# Patient Record
Sex: Female | Born: 1995 | Race: Black or African American | Hispanic: No | Marital: Single | Smoking: Never smoker
Health system: Southern US, Community
[De-identification: ages and names within clinical notes are randomized; demographics above are authoritative.]

## PROBLEM LIST (undated history)

## (undated) DIAGNOSIS — L732 Hidradenitis suppurativa: Secondary | ICD-10-CM

## (undated) DIAGNOSIS — E785 Hyperlipidemia, unspecified: Secondary | ICD-10-CM

## (undated) DIAGNOSIS — J45909 Unspecified asthma, uncomplicated: Secondary | ICD-10-CM

## (undated) DIAGNOSIS — R7303 Prediabetes: Secondary | ICD-10-CM

## (undated) DIAGNOSIS — Z8709 Personal history of other diseases of the respiratory system: Secondary | ICD-10-CM

## (undated) DIAGNOSIS — F909 Attention-deficit hyperactivity disorder, unspecified type: Secondary | ICD-10-CM

## (undated) DIAGNOSIS — R351 Nocturia: Secondary | ICD-10-CM

## (undated) DIAGNOSIS — F32A Depression, unspecified: Secondary | ICD-10-CM

## (undated) DIAGNOSIS — F329 Major depressive disorder, single episode, unspecified: Secondary | ICD-10-CM

## (undated) HISTORY — PX: WISDOM TOOTH EXTRACTION: SHX21

## (undated) HISTORY — DX: Unspecified asthma, uncomplicated: J45.909

## (undated) SURGERY — Surgical Case
Anesthesia: *Unknown

---
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
## 2017-01-13 ENCOUNTER — Other Ambulatory Visit (INDEPENDENT_AMBULATORY_CARE_PROVIDER_SITE_OTHER): Payer: Self-pay | Admitting: *Deleted

## 2017-01-13 ENCOUNTER — Ambulatory Visit (INDEPENDENT_AMBULATORY_CARE_PROVIDER_SITE_OTHER): Payer: Medicaid Other | Admitting: "Endocrinology

## 2017-01-13 VITALS — BP 122/76 | HR 96 | Wt 248.6 lb

## 2017-01-13 DIAGNOSIS — I10 Essential (primary) hypertension: Secondary | ICD-10-CM

## 2017-01-13 DIAGNOSIS — L83 Acanthosis nigricans: Secondary | ICD-10-CM

## 2017-01-13 DIAGNOSIS — E063 Autoimmune thyroiditis: Secondary | ICD-10-CM

## 2017-01-13 DIAGNOSIS — E782 Mixed hyperlipidemia: Secondary | ICD-10-CM | POA: Diagnosis not present

## 2017-01-13 DIAGNOSIS — R7303 Prediabetes: Secondary | ICD-10-CM | POA: Diagnosis not present

## 2017-01-13 DIAGNOSIS — E8881 Metabolic syndrome: Secondary | ICD-10-CM | POA: Diagnosis not present

## 2017-01-13 DIAGNOSIS — F419 Anxiety disorder, unspecified: Secondary | ICD-10-CM

## 2017-01-13 DIAGNOSIS — R748 Abnormal levels of other serum enzymes: Secondary | ICD-10-CM | POA: Diagnosis not present

## 2017-01-13 DIAGNOSIS — E049 Nontoxic goiter, unspecified: Secondary | ICD-10-CM | POA: Diagnosis not present

## 2017-01-13 DIAGNOSIS — E161 Other hypoglycemia: Secondary | ICD-10-CM | POA: Diagnosis not present

## 2017-01-13 DIAGNOSIS — R1013 Epigastric pain: Secondary | ICD-10-CM | POA: Diagnosis not present

## 2017-01-13 DIAGNOSIS — F329 Major depressive disorder, single episode, unspecified: Secondary | ICD-10-CM

## 2017-01-13 LAB — POCT GLUCOSE (DEVICE FOR HOME USE): POC GLUCOSE: 128 mg/dL — AB (ref 70–99)

## 2017-01-13 LAB — POCT GLYCOSYLATED HEMOGLOBIN (HGB A1C): Hemoglobin A1C: 5.9

## 2017-01-13 MED ORDER — METFORMIN HCL 500 MG PO TABS: 500.0000 mg | ORAL_TABLET | Freq: Two times a day (BID) | ORAL | 0 refills | Status: DC

## 2017-01-13 NOTE — Patient Instructions (Addendum)
Follow up visit in 3 months with Mr. Elizabeth Howell or with me. Take ranitidine at breakfast and at dinner. Begin metformin treatment by taking one pill at dinner every day for one week, then take one pill at breakfast and one pill at dinner.

## 2017-01-13 NOTE — Progress Notes (Signed)
Subjective:  Patient Name: Elizabeth Howell Date of Birth: 1995/12/31  MRN: [id number]  Elizabeth Howell  presents to the office today for follow up evaluation and management of her obesity and related issues.    HISTORY OF PRESENT ILLNESS:   Elizabeth Howell is a 21 y.o. African-American young lady.   [name] was accompanied by her grandfather, Mr. Marchell Froman.    71. San was seen for her initial pediatric endocrine evaluation on 11/25/14:  A. Obesity:   1). She was above the 97% for weight at age 26, was down to the 96% briefly at age 26, but had been increasingly growing further above and away from the 97% since then. During the same time period her height increased to about the 85% at age 32-1/2, but had plateaued since age 65.  She developed acanthosis nigricans at least 5 years prior. She had menarche about age 44.     2). Lab tests on 06/01/14 showed a HbA1c of 6.1%.   3). Elizabeth Howell wanted to be slimmer. She was tired of being picked on for being fat. At the same time she wanted to eat what she wanted when she wanted and did not like to exercise.   B. Pertinent past medical history:   1). Medical problems: Asthma in past   2). Surgeries: None   3). Allergies: No known medication allergies; No known environmental allergies   4). GYN: Menarche at age 33-11. LMP a few days ago. Periods were regular.    5). Psych: ADHD and depression:    6). Medications: Zoloft and Vyvanse  C. Pertinent family history - Little is known about dad's FH.   1). Obesity: Mother was heavy. Mom had a pituitary tumor. She later had cardiac arrest. Her maternal great grandmother was also heavy. Daveigh looked like her mom and [name].   2). DM: None   3). Thyroid disease: None   4). ASCVD: None except below.   5). Cancers: None   6). Maternal grandmother had lupus and died of heart failure.  D. Lifestyle:   1). Diet: Lots of carbs, fast food. The maternal grandfather liked his carbs and liked to have  Elias-Fela Solis and others join him for carb treats.    2). Physical activity: None  2. Elizabeth Howell's last [facility] visit occurred on 08/27/16. In the interim she has been healthy.  A. At that visit I again prescribed ranitidine, 150 mg, twice daily. When I asked her if she was taking the ranitidine, she paused for about 20 seconds, and then said "Yes". The ranitidine helped to reduce her hunger "a little bit".    B. She resumed seeing her therapist at [facility] about once a month, but is not seeing a psychiatrist. She has not had her psych medications adjusted for quite some time.   C. She remains on sertraline and Focalin. She is also taking simvastatin, 20 mg/day..  3. Pertinent Review of Systems:  Constitutional: The patient feels "good". Her energy level is "pretty good".  Eyes: Vision is good. There are no significant eye complaints. Neck: The patient has not had any recent swelling and pain in her thyroid area. She does complain of some left posterior neck pains at times.   Heart: Heart rate increases with exercise or other physical activity. The patient has no complaints of palpitations, irregular heat beats, chest pain, or chest pressure. Gastrointestinal: She does not have as much belly hunger. She denies any upset stomach, stomach pains, diarrhea, or constipation. Arms and hands: No  cramps Legs: Muscle mass and strength seem normal. There are no complaints of numbness, tingling, burning, or pain. No edema is noted. Feet: There are no obvious foot problems. There are no complaints of numbness, tingling, burning, or pain. No edema is noted. GYN: She is having a menstrual period now. Periods are regular. Psych: She has had depression and anxiety for some time. She says, "It's a struggle.". She does not think that her medications are working.    PAST MEDICAL, FAMILY, AND SOCIAL HISTORY:  No past medical history on file.  Family History  Problem Relation Age of Onset  .  Hypertension Maternal Grandfather   . Diabetes Other      Current Outpatient Prescriptions:  .  cholecalciferol (VITAMIN D) 1000 units tablet, Take 1,000 Units by mouth daily., Disp: , Rfl:  .  dexmethylphenidate (FOCALIN XR) 20 MG 24 hr capsule, Take 25 mg by mouth daily., Disp: , Rfl:  .  sertraline (ZOLOFT) 100 MG tablet, Take 100 mg by mouth daily., Disp: , Rfl:  .  simvastatin (ZOCOR) 20 MG tablet, Take 1 tablet (20 mg total) by mouth at bedtime., Disp: 30 tablet, Rfl: 2 .  ranitidine (ZANTAC) 150 MG tablet, Take 1 tablet (150 mg total) by mouth 2 (two) times daily., Disp: 60 tablet, Rfl: 6  Allergies as of 01/13/2017  . (No Known Allergies)    1. School and Family: [name] lives with her maternal grandfather, step-grandmother, and half-brother. She is in the 11th grade. The maternal grandfather and step grandmother are the legal guardians and de facto parents for [place] and her brother.  2. Activities: She likes to read and to listen to music. She dances in her room at times, but does not like to exercise. 3. Smoking, alcohol, or drugs: None 4. Primary Care Provider: She has a new PCP, Mr. [name], PA, at the [facility].  5. [facility]: She is seeing a therapist every month.   REVIEW OF SYSTEMS: There are no other significant problems involving Elizabeth Howell's other body systems.   Objective:  Vital Signs:  BP 122/76   Pulse 96   Wt 248 lb 9.6 oz (112.8 kg)   BMI 44.74 kg/m    Ht Readings from Last 3 Encounters:  11/18/16 5' 2.5" (1.588 m) (25 %, Z= -0.69)*  06/21/15 5' 2.28" (1.582 m) (23 %, Z= -0.74)*  03/13/15 5' 2"  (1.575 m) (20 %, Z= -0.84)*   * Growth percentiles are based on CDC 2-20 Years data.   Wt Readings from Last 3 Encounters:  01/13/17 248 lb 9.6 oz (112.8 kg) (>99 %, Z= 2.46)*  11/18/16 250 lb 6.4 oz (113.6 kg) (>99 %, Z= 2.47)*  08/27/16 240 lb 6.4 oz (109 kg) (>99 %, Z= 2.38)*   * Growth  percentiles are based on CDC 2-20 Years data.   HC Readings from Last 3 Encounters:  No data found for [facility]   Body surface area is 2.23 meters squared.  No height on file for this encounter. >99 %ile (Z= 2.46) based on CDC 2-20 Years weight-for-age data using vitals from 01/13/2017.   PHYSICAL EXAM:  Constitutional: The patient appears healthy, but even more morbidly obese. She has gained another 8 pounds since her last visit, equivalent to a net gain of about 200 calories per day. She is alert, but has a very subdued affect. She does not volunteer any information. When she answers questions she gives one-word answers. I have the sense that she  tells me only what she wants me to hear.  Face: The face appears normal.  Eyes: There is no obvious arcus or proptosis. Moisture appears normal. Mouth: The oropharynx and tongue appear normal. Oral moisture is normal. There is no oral hyperpigmentation. Neck: The neck appears to be visibly enlarged. No carotid bruits are noted. The thyroid gland is still enlarged, but smaller today, at about 21-22 grams in size. The consistency of the thyroid gland is full, but softer than at her last visit. The thyroid gland is not tender to palpation. She has 3+ circumferential acanthosis nigricans.  Lungs: The lungs are clear to auscultation. Air movement is good. Heart: Heart rate and rhythm are regular. Heart sounds S1 and S2 are normal. I did not appreciate any pathologic cardiac murmurs. Abdomen: The abdomen is quite enlarged. Bowel sounds are normal. There is no obvious hepatomegaly, splenomegaly, or other mass effect.  Arms: Muscle size and bulk are normal for age. Hands: There is no obvious tremor. Phalangeal and metacarpophalangeal joints are normal. Palmar muscles are normal. Palmar skin is normal. Palmar moisture is also normal. There is no palmar hyperpigmentation Legs: Muscles appear normal for age. No edema is present. Neurologic: Strength is normal for  age in both the upper and lower extremities. Muscle tone is normal. Sensation to touch is normal in both legs.   Skin: She has multiple dark striae of her lateral sides.   LAB DATA:  No results found for this or any previous visit (from the past 504 hour(s)).   Labs 01/13/17: HbA1c 5.9%, CBG 128  Labs 11/18/16: TSH 2.85, T4 5.0; CBC normal; CMP normal, except ALP 107 (ref 43-101); cholesterol 288, triglycerides 145, HDL 39, LDL 220  Labs 08/27/16: HbA1c 6.0%  Labs 06/21/15: HbA1c 5.6%  Labs 11/25/14: HbA1c 5.5%; TSH 0.993, free T4 1.08, free T3 3.1, TPO antibody <1, anti-thyroglobulin antibody 1; C-peptide 1.81  Labs 08/16/14: CMP normal; ESR 21 (0-22); CBC normal  Labs: 06/01/14: HbA1c 6.1%, cholesterol 258, triglycerides 151, HDL 48, LDL 180; 25-OH vitamin D 39; TSH 1.743; CMP normal    Assessment and Plan:   ASSESSMENT:  1-3. Morbid obesity/insulin resistance/hyperinsulinemia:   A. Her overly fat adipose cells produce excessive amounts of harmful cytokines. Some cytokines cause hypertension. Some cytokines cause excess resistance to insulin. Her young beta cells compensate by producing excessive amounts of insulin. The hyperinsulinemia, in turn, causes acanthosis nigricans and excess gastric acid secretion, resulting in excess belly hunger, stomach upset, and epigastric pains, all grouped as "Dyspepsia".   BHerschel [place] continues to gain weight at about the rate of 2 pounds per month. Although her grandfather and I want her to exercise, Legrand Como won't do so. She is also not motivated to change her diet.   4. Hypertension: As above. Her SBP is normal today, but her DBP is mildly elevated.   5. Acanthosis: As above. Her acanthosis is worse, paralleling her gain in fat weight.  6. Dyspepsia: As above. Unfortunately, the ranitidine is not enough to offset her carb intake. I wonder how much ranitidine she actually takes. 7. Striae: The striae are essentially unchanged since her last visit.  These striae do not appear to be related to Cushing's syndrome. 8. Hyperlipidemia: The elevated lipids were due in part to high glucose levels and high free fatty acid levels, but there may also be a genetic predisposition as well.  Her cholesterol and DL were elevated in March, so Mr. Altamease Oiler appropriately started her on a statin. Simvastatin can be  a very effective drug and does not cause adverse effects in most patients, but it is also the currently available statin that is most likely to cause transaminase elevation and myalgia/myositis. I explained this fact to the family and asked them to call Mr. Altamease Oiler or me if she develops sustained muscle cramps.  9. Goiter: Her thyroid gland is slightly smaller, but still enlarged. The process of waxing and waning of thyroid gland size is c/w evolving Hashimoto's thyroiditis. She was euthyroid in March 2018, at about the lower 20% of the true normal thyroid hormone range.  10. Thyroiditis: She had had intermittent tenderness and swelling in the thyroid bed for several months prior to her initial visit and was tender bilaterally at that visit. She has also had tenderness in her thyroid bed prior to her last visit, but has not had any since then. Her Hashimoto's thyroiditis is clinically quiescent today. 11. Prediabetes:   A. Her HbA1c of 6.1% was definitely in the pre-diabetic range in September 2015. Her A1c at her last three visits were 5.5% 5.6%, and 6.0% respectively. The  HbA1c value of 6.0 2 in December 2017 was again in the prediabetes range.    B. Today, Michaela's HbA1c is still elevated at 5.9%, also c/w the diagnosis of pre-diabetes.   C. Darsi's C-peptide was mid-normal in March 2017 but was relatively low for her level of obesity. If she does not control her carb intake and does not make serious efforts to lose fat weight, her BGs will rise further into the diabetes range.   D. She needs treatment with metformin. Whether she will actually take the  medication is another question.  12. Elevated alkaline phosphatase (ALP): The most likely cause of her increased ALP would be vitamin D deficiency, low or low-normal serum calcium, and elevated or high-normal PTH. We need to check these parameters now.    PLAN:  1. Diagnostic: PTH, calcium, 25-OH vitamin D 2. Therapeutic: Eat Right Diet, exercise for an hour per day. Continue ranitidine, 150 mg, twice daily. Start metformin,  3. Patient education: We discussed all of the above at great length. Because the grandfather has not been here for prior visits, he had many questions about her obesity, prediabetes, and her medications.I reviewed all of the above issues with him. I again discussed with her and her grandfather how eating right and exercise could help her achieve her goal of weight loss. I reviewed our Eat Right Diet again with her and her grandfather. I asked her to exercise for one hour each day. I suggested that the family discuss follow up with psychiatry with her therapist.   4. Follow-up: 3 months.   Level of Service: This visit lasted in excess of 55 minutes. More than 50% of the visit was devoted to counseling.  Tillman Sers, MD, CDE Adult and Pediatric Endocrinology

## 2017-01-14 ENCOUNTER — Encounter (INDEPENDENT_AMBULATORY_CARE_PROVIDER_SITE_OTHER): Payer: Self-pay | Admitting: "Endocrinology

## 2017-01-14 DIAGNOSIS — F32A Depression, unspecified: Secondary | ICD-10-CM | POA: Insufficient documentation

## 2017-01-14 DIAGNOSIS — F329 Major depressive disorder, single episode, unspecified: Secondary | ICD-10-CM | POA: Insufficient documentation

## 2017-01-14 DIAGNOSIS — F419 Anxiety disorder, unspecified: Secondary | ICD-10-CM

## 2017-01-14 LAB — PTH, INTACT AND CALCIUM
Calcium: 9.4 mg/dL (ref 8.9–10.4)
PTH: 31 pg/mL (ref 14–64)

## 2017-01-14 LAB — VITAMIN D 25 HYDROXY (VIT D DEFICIENCY, FRACTURES): Vit D, 25-Hydroxy: 31 ng/mL (ref 30–100)

## 2017-01-17 ENCOUNTER — Other Ambulatory Visit: Payer: Self-pay | Admitting: "Endocrinology

## 2017-01-17 DIAGNOSIS — R1013 Epigastric pain: Secondary | ICD-10-CM

## 2017-01-28 ENCOUNTER — Telehealth (INDEPENDENT_AMBULATORY_CARE_PROVIDER_SITE_OTHER): Payer: Self-pay | Admitting: "Endocrinology

## 2017-01-28 NOTE — Telephone Encounter (Signed)
°  Who's calling (name and relationship to patient) : Legrand Como (granddad)  Best contact number: [phone]  Provider they see: Tobe Sos  Reason for call: Grandmother stated that the medication is making patient vomit.  She start taking it 2 times a day.  Please call so we can know whether to stop the medication.  Please call back today if all possible.     PRESCRIPTION REFILL ONLY  Name of prescription:  Pharmacy:

## 2017-01-28 NOTE — Telephone Encounter (Signed)
Routed to provider

## 2017-01-29 ENCOUNTER — Telehealth (INDEPENDENT_AMBULATORY_CARE_PROVIDER_SITE_OTHER): Payer: Self-pay | Admitting: "Endocrinology

## 2017-01-29 NOTE — Telephone Encounter (Signed)
1. Grandmother called yesterday, but I did not see the call until midnight. 2. I tried to call her at home, but there was not any answer or opportunity to leave a voicemail message. I also tried to call her on her cell phone, but she was not available and her voicemail had not been activated. I also tried the other cell phone number listed on her EPIC record, but again there was no answer or ability to leave a voicemail message.  Tillman Sers, MD, CDE

## 2017-01-30 ENCOUNTER — Telehealth (INDEPENDENT_AMBULATORY_CARE_PROVIDER_SITE_OTHER): Payer: Self-pay | Admitting: "Endocrinology

## 2017-01-30 NOTE — Telephone Encounter (Signed)
1. I called the grandfather and was able to talk with him. 2. Subjective: Elizabeth Howell tolerated the one 500 mg metformin pill in the evening for the first week quite well. Unfortunately, when she added the second pill at breakfast she developed nausea, vomiting, and diarrhea. 3. Assessment: Elizabeth Howell is having trouble tolerating the 500 mg of metformin twice daily. 4. Plan:  A. Until school is over, give her just the one 500 mg metformin pill at dinner each evening.   B. After school ends for the year, change her to 1/2 of a 500 mg metformin pill, twice daily.Call if having problems.  Tillman Sers, MD, CDE

## 2017-02-08 ENCOUNTER — Ambulatory Visit (HOSPITAL_COMMUNITY)
Admission: EM | Admit: 2017-02-08 | Discharge: 2017-02-08 | Disposition: A | Discharge status: Home / Self Care | Payer: Medicaid Other | Attending: Family Medicine | Admitting: Family Medicine

## 2017-02-08 ENCOUNTER — Encounter (HOSPITAL_COMMUNITY): Payer: Self-pay | Admitting: Family Medicine

## 2017-02-08 DIAGNOSIS — L03818 Cellulitis of other sites: Secondary | ICD-10-CM | POA: Diagnosis not present

## 2017-02-08 DIAGNOSIS — W540XXA Bitten by dog, initial encounter: Secondary | ICD-10-CM | POA: Diagnosis not present

## 2017-02-08 DIAGNOSIS — S61252A Open bite of right middle finger without damage to nail, initial encounter: Secondary | ICD-10-CM

## 2017-02-08 MED ORDER — DOXYCYCLINE HYCLATE 100 MG PO TABS: 100.0000 mg | ORAL_TABLET | Freq: Two times a day (BID) | ORAL | 0 refills | Status: DC

## 2017-02-08 NOTE — ED Triage Notes (Signed)
Pt reports her dog Advertising account executive?) bit her on right middle finger 4 days ago  Sts dog is up to date w/vaccination  A&O x4... NAD... Ambulatory

## 2017-02-08 NOTE — ED Provider Notes (Addendum)
[place]    CSN: [id number] Arrival date & time: 02/08/17  1415     History   Chief Complaint Chief Complaint  Patient presents with  . Animal Bite    HPI Will Schier is a 21 y.o. female.   Is an 21 year old woman who presents with recent dog bite to her right hand. The middle finger was lacerated superficially and over the last day or so she's had difficulty bending the finger without pain.  She had a tetanus shot when she was in middle school, and she is now about to graduate from high school 4 years later. Patient goes to [employer].      History reviewed. No pertinent past medical history.  Patient Active Problem List   Diagnosis Date Noted  . Anxiety and depression 01/14/2017  . Dyslipidemia 11/19/2016  . Morbid obesity ([place]) 11/25/2014  . Insulin resistance 11/25/2014  . Hyperinsulinemia 11/25/2014  . Essential hypertension, benign 11/25/2014  . Acanthosis nigricans, acquired 11/25/2014  . Dyspepsia 11/25/2014  . Goiter 11/25/2014  . Thyroiditis, autoimmune 11/25/2014  . 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 Skin striae 11/25/2014  . Prediabetes 11/25/2014    Past Surgical History:  Procedure Laterality Date  . WISDOM TOOTH EXTRACTION      OB History    No data available       Home Medications    Prior to Admission medications   Medication Sig Start Date End Date Taking? Authorizing Provider  cholecalciferol (VITAMIN D) 1000 units tablet Take 1,000 Units by mouth daily.    [provider]  dexmethylphenidate (FOCALIN XR) 20 MG 24 hr capsule Take 25 mg by mouth daily.    [provider]  doxycycline (VIBRA-TABS) 100 MG tablet Take 1 tablet (100 mg total) by mouth 2 (two) times daily. 02/08/17   Robyn Haber, MD  metFORMIN (GLUCOPHAGE) 500 MG tablet Take 1 tablet (500 mg total) by mouth 2 (two) times daily with a meal. 01/13/17   Sherrlyn Hock, MD  ranitidine (ZANTAC) 150 MG tablet TAKE 1 TABLET  BY MOUTH TWICE A DAY 01/20/17   Sherrlyn Hock, MD  sertraline (ZOLOFT) 100 MG tablet Take 100 mg by mouth daily.    [provider]  simvastatin (ZOCOR) 20 MG tablet Take 1 tablet (20 mg total) by mouth at bedtime. 11/19/16   Clent Demark, PA-C    Family History Family History  Problem Relation Age of Onset  . Hypertension Maternal Grandfather   . Diabetes Other     Social History Social History  Substance Use Topics  . Smoking status: Never Smoker  . Smokeless tobacco: Never Used  . Alcohol use Not on file     Allergies   Patient has no known allergies.   Review of Systems Review of Systems  Skin: Positive for wound.  All other systems reviewed and are negative.    Physical Exam Triage Vital Signs ED Triage Vitals  Enc Vitals Group     BP      Pulse      Resp      Temp      Temp src      SpO2      Weight      Height      Head Circumference      Peak Flow      Pain Score      Pain Loc      Pain Edu?      Excl. in [place]?  No data found.   Updated Vital Signs BP 118/78 (BP Location: Left Arm)   Pulse (!) 113   Temp 99.5 F (37.5 C) (Oral)   Resp 20   SpO2 99%    Physical Exam  Constitutional: She is oriented to person, place, and time. She appears well-developed and well-nourished.  HENT:  Right Ear: External ear normal.  Left Ear: External ear normal.  Eyes: Conjunctivae are normal. Pupils are equal, round, and reactive to light.  Neck: Normal range of motion. Neck supple.  Pulmonary/Chest: Effort normal.  Musculoskeletal: Normal range of motion.  Neurological: She is alert and oriented to person, place, and time.  Skin: Skin is warm and dry.  Puncture wound just proximal to the nail on the middle right finger. There are superficial streaks of laceration on the distal phalanx of the ipsilateral finger and mild swelling and tenderness in the distal phalanx.  Nursing note and vitals reviewed.    UC Treatments / Results   Labs (all labs ordered are listed, but only abnormal results are displayed) Labs Reviewed - No data to display  EKG  EKG Interpretation None       Radiology No results found.  Procedures Procedures (including critical care time)  Medications Ordered in UC Medications - No data to display   Initial Impression / Assessment and Plan / UC Course  I have reviewed the triage vital signs and the nursing notes.  Pertinent labs & imaging results that were available during my care of the patient were reviewed by me and considered in my medical decision making (see chart for details).     Final Clinical Impressions(s) / UC Diagnoses   Final diagnoses:  Dog bite, initial encounter  Cellulitis of other specified site    New Prescriptions New Prescriptions   DOXYCYCLINE (VIBRA-TABS) 100 MG TABLET    Take 1 tablet (100 mg total) by mouth 2 (two) times daily.     Robyn Haber, MD 02/08/17 1428    Robyn Haber, MD 02/08/17 1429

## 2017-02-17 ENCOUNTER — Encounter (INDEPENDENT_AMBULATORY_CARE_PROVIDER_SITE_OTHER): Payer: Self-pay | Admitting: Physician Assistant

## 2017-02-17 ENCOUNTER — Ambulatory Visit (INDEPENDENT_AMBULATORY_CARE_PROVIDER_SITE_OTHER): Payer: Medicaid Other | Admitting: Physician Assistant

## 2017-02-17 VITALS — BP 105/71 | HR 123 | Temp 98.4°F | Wt 252.8 lb

## 2017-02-17 DIAGNOSIS — F329 Major depressive disorder, single episode, unspecified: Secondary | ICD-10-CM | POA: Diagnosis not present

## 2017-02-17 DIAGNOSIS — F988 Other specified behavioral and emotional disorders with onset usually occurring in childhood and adolescence: Secondary | ICD-10-CM | POA: Diagnosis not present

## 2017-02-17 DIAGNOSIS — F32A Depression, unspecified: Secondary | ICD-10-CM

## 2017-02-17 DIAGNOSIS — F419 Anxiety disorder, unspecified: Secondary | ICD-10-CM | POA: Diagnosis not present

## 2017-02-17 NOTE — Patient Instructions (Signed)

## 2017-02-17 NOTE — Progress Notes (Signed)
Subjective:  Patient ID: Elizabeth Howell, female    DOB: 1996/07/29  Age: 21 y.o. MRN: [id number]  CC: depression, anxiety, and ADD  HPI Deondria Puryear is a 21 y.o. female with a PMH of Depression, anxiety, and ADD presents on follow up for these conditions. Goes to [place] once a month now. Has been taking Sertraline 100mg  continuously for approximately 1.5 years with little noticeable effect. Says she feels well in regards to mood, however. Uses Focalin XR to little effect. Mother states pt has tried Adderrall, Vyvanse, and Concerta with little effect. Patient is getting ready to graduate and has been working on improving her grades. Teachers have applauded her efforts and pt's mother states that she has been receiving the top grades in her classroom. Patient no longer feels bullied. Main focus now is on graduating, does not have a set plan or goal for after graduation. Patient does not endorse any symptoms or other complaints.     Outpatient Medications Prior to Visit  Medication Sig Dispense Refill  . cholecalciferol (VITAMIN D) 1000 units tablet Take 1,000 Units by mouth daily.    Marland Kitchen dexmethylphenidate (FOCALIN XR) 20 MG 24 hr capsule Take 25 mg by mouth daily.    Marland Kitchen doxycycline (VIBRA-TABS) 100 MG tablet Take 1 tablet (100 mg total) by mouth 2 (two) times daily. 20 tablet 0  . metFORMIN (GLUCOPHAGE) 500 MG tablet Take 1 tablet (500 mg total) by mouth 2 (two) times daily with a meal. 60 tablet 0  . ranitidine (ZANTAC) 150 MG tablet TAKE 1 TABLET BY MOUTH TWICE A DAY 60 tablet 6  . sertraline (ZOLOFT) 100 MG tablet Take 100 mg by mouth daily.    . simvastatin (ZOCOR) 20 MG tablet Take 1 tablet (20 mg total) by mouth at bedtime. 30 tablet 2   No facility-administered medications prior to visit.      ROS Review of Systems  Constitutional: Negative for chills, fever and malaise/fatigue.  Eyes: Negative for blurred vision.  Respiratory: Negative for  shortness of breath.   Cardiovascular: Negative for chest pain and palpitations.  Gastrointestinal: Negative for abdominal pain and nausea.  Genitourinary: Negative for dysuria and hematuria.  Musculoskeletal: Negative for joint pain and myalgias.  Skin: Negative for rash.  Neurological: Negative for tingling and headaches.  Psychiatric/Behavioral: Negative for depression. The patient is not nervous/anxious.     Objective:  BP 105/71 (BP Location: Right Arm, Patient Position: Sitting, Cuff Size: Large)   Pulse (!) 123   Temp 98.4 F (36.9 C) (Oral)   Wt 252 lb 12.8 oz (114.7 kg)   LMP 02/16/2017 (Exact Date)   SpO2 96%   BMI 45.50 kg/m   BP/Weight 02/17/2017 02/08/2017 7/82/9562  Systolic BP 130 865 784  Diastolic BP 71 78 76  Wt. (Lbs) 252.8 - 248.6  BMI 45.5 - 44.74      Physical Exam  Constitutional: She is oriented to person, place, and time.  Well developed, obese, NAD, polite  HENT:  Head: Normocephalic and atraumatic.  Eyes: No scleral icterus.  Neck: Normal range of motion. Neck supple. No thyromegaly present.  Cardiovascular: Normal rate, regular rhythm and normal heart sounds.   Pulmonary/Chest: Effort normal and breath sounds normal.  Abdominal: There is no tenderness.  Neurological: She is alert and oriented to person, place, and time.  Skin: Skin is warm and dry. No rash noted. No erythema. No pallor.  Psychiatric: She has a normal mood and affect. Her behavior is normal.  Thought content normal.  Vitals reviewed.    Assessment & Plan:   1. Anxiety and depression - Continue on sertraline 100mg  - Continue therapy with [facility] - 15 minutes of counseling patient with focus on setting goals and looking forward to her future.  2. Attention deficit disorder, unspecified hyperactivity presence - Continue on Focalin XR - Address issue of ADD with current psychiatrist. Seems that patient is able to obtain high grades if she  applies herself or is properly motivated.    No orders of the defined types were placed in this encounter.   Follow-up: Return in about 3 months (around 05/20/2017) for physical exam.   Clent Demark PA

## 2017-02-23 ENCOUNTER — Other Ambulatory Visit (INDEPENDENT_AMBULATORY_CARE_PROVIDER_SITE_OTHER): Payer: Self-pay | Admitting: "Endocrinology

## 2017-02-23 DIAGNOSIS — R7303 Prediabetes: Secondary | ICD-10-CM

## 2017-03-14 ENCOUNTER — Other Ambulatory Visit (INDEPENDENT_AMBULATORY_CARE_PROVIDER_SITE_OTHER): Payer: Self-pay | Admitting: Physician Assistant

## 2017-03-14 ENCOUNTER — Telehealth (INDEPENDENT_AMBULATORY_CARE_PROVIDER_SITE_OTHER): Payer: Self-pay | Admitting: Physician Assistant

## 2017-03-14 DIAGNOSIS — E785 Hyperlipidemia, unspecified: Secondary | ICD-10-CM

## 2017-03-14 MED ORDER — SIMVASTATIN 20 MG PO TABS: 20.0000 mg | ORAL_TABLET | Freq: Every day | ORAL | 2 refills | Status: DC

## 2017-03-14 NOTE — Telephone Encounter (Signed)
FWD to PCP. Elizabeth Howell Elizabeth Howell, CMA  

## 2017-03-14 NOTE — Telephone Encounter (Signed)
Patient notified. Nat Christen, CMA

## 2017-03-14 NOTE — Telephone Encounter (Signed)
Simvastatin has been sent. You may also call pt to let her know. Thanks

## 2017-03-14 NOTE — Telephone Encounter (Signed)
Shirlean Mylar patient's grandmother called stated [facility] informed her they have faxed refill requests for patient RX:  simvastatin (ZOCOR) 20 MG tablet [[id number]]   Please send Rx to : Pharmacy:  [id number] - [place], [place]. DEA #:  [id number]   Please let patient know when rx sent.  Patient is out of medication.

## 2017-04-05 ENCOUNTER — Other Ambulatory Visit (INDEPENDENT_AMBULATORY_CARE_PROVIDER_SITE_OTHER): Payer: Self-pay | Admitting: "Endocrinology

## 2017-04-05 DIAGNOSIS — R7303 Prediabetes: Secondary | ICD-10-CM

## 2017-04-14 ENCOUNTER — Ambulatory Visit (INDEPENDENT_AMBULATORY_CARE_PROVIDER_SITE_OTHER): Payer: Medicaid Other | Admitting: "Endocrinology

## 2017-04-16 ENCOUNTER — Telehealth (INDEPENDENT_AMBULATORY_CARE_PROVIDER_SITE_OTHER): Payer: Self-pay | Admitting: Physician Assistant

## 2017-04-16 NOTE — Telephone Encounter (Signed)
Go to Urgent Care, that is what they are there for.

## 2017-04-16 NOTE — Telephone Encounter (Signed)
Patient grandmother called stated patient has painful cyst under her arm, looks red, no fever but last time patient had cyst, drainage was required.   Gave appt for 04/25/2017,  Per grandmother would like for patient to be seen sooner or if [name] PA can recommend something for relief until comes to appt.  Please follow up with patient

## 2017-04-16 NOTE — Telephone Encounter (Signed)
FWD to PCP. Tempestt S Roberts, CMA  

## 2017-04-17 NOTE — Telephone Encounter (Signed)
Called patient and grandmother, no answer and no voicemail to leave a message. Nat Christen, CMA

## 2017-04-19 ENCOUNTER — Encounter (HOSPITAL_COMMUNITY): Payer: Self-pay | Admitting: Emergency Medicine

## 2017-04-19 ENCOUNTER — Ambulatory Visit (HOSPITAL_COMMUNITY)
Admission: EM | Admit: 2017-04-19 | Discharge: 2017-04-19 | Disposition: A | Discharge status: Home / Self Care | Payer: Medicaid Other | Attending: Internal Medicine | Admitting: Internal Medicine

## 2017-04-19 DIAGNOSIS — L02411 Cutaneous abscess of right axilla: Secondary | ICD-10-CM | POA: Diagnosis not present

## 2017-04-19 MED ORDER — DOXYCYCLINE HYCLATE 100 MG PO CAPS: 100.0000 mg | ORAL_CAPSULE | Freq: Two times a day (BID) | ORAL | 0 refills | Status: AC

## 2017-04-19 NOTE — ED Triage Notes (Signed)
The patient presented to the [facility] with a complaint of abscesses under both arms x 1 month.

## 2017-04-19 NOTE — Discharge Instructions (Signed)
Take antibiotics as directed. Keep incision site clean and dry. Take tylenol/motrin for pain. Monitor for worsening of symptoms, fever, pain, surrounding erythema/warmth despite antibiotic use, follow up for reevaluation. You can try chlorhexidine cleaning of your skin 1-2 times a week, especially after shaving to help prevent recurrence. Follow up with PCP for further evaluation needed.

## 2017-04-19 NOTE — ED Provider Notes (Signed)
CSN: [id number]     Arrival date & time 04/19/17  1214 History   None    Chief Complaint  Patient presents with  . Abscess   (Consider location/radiation/quality/duration/timing/severity/associated sxs/prior Treatment) 21 year old female comes in for bilateral axilla abscess. She states she has recurrent abscess of the left axilla, but has now developed an abscess in her right axilla as well. Lesion on her left axilla has been present for one month, slight tenderness on palpation, otherwise denies waxing and waning of size, surrounding erythema, increased warmth. Right axilla abscess developed 2 days ago, which is very tender to palpation, also denies waxing and waning of size, surrounding erythema, increased warmth. Denies fever, chills, night sweats. Has not taken anything for pain. She does shave her underarms. No changes in skin products.      History reviewed. No pertinent past medical history. Past Surgical History:  Procedure Laterality Date  . WISDOM TOOTH EXTRACTION     Family History  Problem Relation Age of Onset  . Hypertension Maternal Grandfather   . Diabetes Other    Social History  Substance Use Topics  . Smoking status: Never Smoker  . Smokeless tobacco: Never Used  . Alcohol use Not on file   OB History    No data available     Review of Systems  Reason unable to perform ROS: See HPI as above.    Allergies  Patient has no known allergies.  Home Medications   Prior to Admission medications   Medication Sig Start Date End Date Taking? Authorizing Provider  dexmethylphenidate (FOCALIN XR) 20 MG 24 hr capsule Take 25 mg by mouth daily.   Yes [provider]  ranitidine (ZANTAC) 150 MG tablet TAKE 1 TABLET BY MOUTH TWICE A DAY 01/20/17  Yes Sherrlyn Hock, MD  simvastatin (ZOCOR) 20 MG tablet Take 1 tablet (20 mg total) by mouth at bedtime. 03/14/17  Yes Clent Demark, PA-C  doxycycline (VIBRAMYCIN) 100 MG capsule Take 1 capsule (100 mg  total) by mouth 2 (two) times daily. 04/19/17 04/26/17  Ok Edwards, PA-C   Meds Ordered and Administered this Visit  Medications - No data to display  BP 111/69 (BP Location: Right Arm)   Pulse 94   Temp 98.3 F (36.8 C) (Oral)   Resp 20   SpO2 100%  No data found.   Physical Exam  Constitutional: She is oriented to person, place, and time. She appears well-developed and well-nourished. No distress.  HENT:  Head: Normocephalic and atraumatic.  Neurological: She is alert and oriented to person, place, and time.  Skin:  Left axilla with scar tissue consistent with prior I&D's. 2 cm x 1 cm cyst palpated. No induration felt, no surrounding erythema, increased warmth.  Right axilla abscess palpated, 1 cm x 1 cm size. Small drainage seen. No surrounding erythema, increased warmth.  Psychiatric: She has a normal mood and affect. Her behavior is normal. Judgment normal.    Urgent Care Course     .Marland KitchenIncision and Drainage Date/Time: 04/19/2017 1:54 PM Performed by: Cathlean Sauer V Authorized by: Sherlene Shams   Consent:    Consent obtained:  Verbal   Consent given by:  Patient   Risks discussed:  Bleeding, incomplete drainage, infection and pain   Alternatives discussed:  Alternative treatment Location:    Type:  Abscess   Size:  1cm x 1 cm   Location:  Upper extremity   Upper extremity location:  Arm   Arm location:  R upper arm Pre-procedure details:    Skin preparation:  Betadine Anesthesia (see MAR for exact dosages):    Anesthesia method:  Local infiltration   Local anesthetic:  Lidocaine 2% WITH epi Procedure type:    Complexity:  Simple Procedure details:    Needle aspiration: no     Incision types:  Stab incision   Scalpel blade:  11   Wound management:  Debrided   Drainage:  Purulent and bloody   Drainage amount:  Scant   Wound treatment:  Wound left open   Packing materials:  None Post-procedure details:    Patient tolerance of procedure:  Tolerated well, no  immediate complications    (including critical care time)  Labs Review Labs Reviewed - No data to display  Imaging Review No results found.     MDM   1. Abscess of axilla, right    Discussed with patient I am suspicions that left axilla cyst is an abscess. Given prior I&D, discussed possibility of scar tissue versus abscess. Discussed with patient , right axilla abscess treatment with I&D versus antibiotic treatment. Patient elected to proceed with I&D for the right abscess, and antibiotic treatment to cover for possible abscess of the left axilla. Patient tolerated procedure well. Discussed post I&D care. Start Doxycylcine 100mg  BID x 7 days. Side effects discussed with patient. Monitor for worsening of symptoms, fever, increased surrounding erythema/increased warmth despite antibiotic treatment, follow-up for reevaluation.   Ok Edwards, PA-C 04/19/17 [phone]

## 2017-04-25 ENCOUNTER — Ambulatory Visit (INDEPENDENT_AMBULATORY_CARE_PROVIDER_SITE_OTHER): Payer: Medicaid Other | Admitting: Physician Assistant

## 2017-05-03 ENCOUNTER — Other Ambulatory Visit (INDEPENDENT_AMBULATORY_CARE_PROVIDER_SITE_OTHER): Payer: Self-pay | Admitting: "Endocrinology

## 2017-05-03 DIAGNOSIS — R7303 Prediabetes: Secondary | ICD-10-CM

## 2017-05-31 ENCOUNTER — Other Ambulatory Visit (INDEPENDENT_AMBULATORY_CARE_PROVIDER_SITE_OTHER): Payer: Self-pay | Admitting: "Endocrinology

## 2017-05-31 DIAGNOSIS — R7303 Prediabetes: Secondary | ICD-10-CM

## 2017-07-02 ENCOUNTER — Encounter (INDEPENDENT_AMBULATORY_CARE_PROVIDER_SITE_OTHER): Payer: Self-pay | Admitting: Physician Assistant

## 2017-07-02 ENCOUNTER — Ambulatory Visit (INDEPENDENT_AMBULATORY_CARE_PROVIDER_SITE_OTHER): Payer: Medicaid Other | Admitting: Physician Assistant

## 2017-07-02 VITALS — BP 107/73 | HR 109 | Temp 97.5°F | Wt 258.4 lb

## 2017-07-02 DIAGNOSIS — L732 Hidradenitis suppurativa: Secondary | ICD-10-CM

## 2017-07-02 DIAGNOSIS — R7303 Prediabetes: Secondary | ICD-10-CM | POA: Diagnosis not present

## 2017-07-02 DIAGNOSIS — Z23 Encounter for immunization: Secondary | ICD-10-CM

## 2017-07-02 LAB — POCT GLYCOSYLATED HEMOGLOBIN (HGB A1C): Hemoglobin A1C: 6

## 2017-07-02 MED ORDER — METFORMIN HCL 500 MG PO TABS: 500.0000 mg | ORAL_TABLET | Freq: Two times a day (BID) | ORAL | 5 refills | Status: DC

## 2017-07-02 MED ORDER — DOXYCYCLINE HYCLATE 100 MG PO TABS: 100.0000 mg | ORAL_TABLET | Freq: Two times a day (BID) | ORAL | 0 refills | Status: DC

## 2017-07-02 MED ORDER — CLINDAMYCIN PHOSPHATE 1 % EX GEL: Freq: Two times a day (BID) | CUTANEOUS | 0 refills | Status: DC

## 2017-07-02 NOTE — Progress Notes (Signed)
Subjective:  Patient ID: Elizabeth Howell, female    DOB: 10/31/1995  Age: 21 y.o. MRN: [id number]  CC:  Skin infection  HPI  Elizabeth Howell is a 21 y.o. female with a PMH of Depression, anxiety, and ADD presents with complaint of boil in the left axilla. This is the fifth occurrence in the same location. Has seen hardening of the skin with reinfection and drainage. The lesion has partially drained today. Current pain is moderate. Has not taken anything for relief. Does not endorse any other symptoms.    Outpatient Medications Prior to Visit  Medication Sig Dispense Refill  . dexmethylphenidate (FOCALIN XR) 20 MG 24 hr capsule Take 25 mg by mouth daily.    . metFORMIN (GLUCOPHAGE) 500 MG tablet TAKE 1 TABLET (500 MG TOTAL) BY MOUTH 2 (TWO) TIMES DAILY WITH A MEAL. 60 tablet 1  . ranitidine (ZANTAC) 150 MG tablet TAKE 1 TABLET BY MOUTH TWICE A DAY 60 tablet 6  . simvastatin (ZOCOR) 20 MG tablet Take 1 tablet (20 mg total) by mouth at bedtime. 30 tablet 2   No facility-administered medications prior to visit.      ROS Review of Systems  Constitutional: Negative for chills, fever and malaise/fatigue.  Eyes: Negative for blurred vision.  Respiratory: Negative for shortness of breath.   Cardiovascular: Negative for chest pain and palpitations.  Gastrointestinal: Negative for abdominal pain and nausea.  Genitourinary: Negative for dysuria and hematuria.  Musculoskeletal: Negative for joint pain and myalgias.  Skin: Negative for rash.  Neurological: Negative for tingling and headaches.  Psychiatric/Behavioral: Negative for depression. The patient is not nervous/anxious.     Objective:  BP 107/73 (BP Location: Right Arm, Patient Position: Sitting, Cuff Size: Large)   Pulse (!) 109   Temp (!) 97.5 F (36.4 C) (Oral)   Wt 258 lb 6.4 oz (117.2 kg)   LMP 06/18/2017 (Approximate)   SpO2 94%   BMI 46.51 kg/m   BP/Weight 07/02/2017 11/22/7562 11/16/2949  Systolic BP 884 166 063   Diastolic BP 73 69 71  Wt. (Lbs) 258.4 - 252.8  BMI 46.51 - 45.5      Physical Exam  Constitutional: She is oriented to person, place, and time.  Well developed, obese, NAD, polite  HENT:  Head: Normocephalic and atraumatic.  Eyes: No scleral icterus.  Neck: Normal range of motion. Neck supple. No thyromegaly present.  Cardiovascular: Normal rate, regular rhythm and normal heart sounds.   Pulmonary/Chest: Effort normal and breath sounds normal.  Musculoskeletal: She exhibits no edema.  Neurological: She is alert and oriented to person, place, and time. No cranial nerve deficit. Coordination normal.  Skin: Skin is warm and dry. No rash noted. No erythema. No pallor.  Small papular lesion of approximately one centimeter in diameter. No bleeding, suppuration, induration, erythema, streaking, or stranding.  Psychiatric: She has a normal mood and affect. Her behavior is normal. Thought content normal.  Vitals reviewed.    Assessment & Plan:   1. Hidradenitis suppurativa of left axilla - Ambulatory referral to General Surgery - Begin doxycycline (VIBRA-TABS) 100 MG tablet; Take 1 tablet (100 mg total) by mouth 2 (two) times daily.  Dispense: 20 tablet; Refill: 0 - Begin clindamycin (CLINDAGEL) 1 % gel; Apply topically 2 (two) times daily.  Dispense: 30 g; Refill: 0  2. Prediabetes - HgB A1c 6.0% in clinic today. Last A1c 5.9% in April 2018 - CMP - Refill Metformin 500 mg BID.    Meds ordered this encounter  Medications  .  doxycycline (VIBRA-TABS) 100 MG tablet    Sig: Take 1 tablet (100 mg total) by mouth 2 (two) times daily.    Dispense:  20 tablet    Refill:  0    Order Specific Question:   Supervising Provider    Answer:   Tresa Garter [id number]  . clindamycin (CLINDAGEL) 1 % gel    Sig: Apply topically 2 (two) times daily.    Dispense:  30 g    Refill:  0    Order Specific Question:   Supervising Provider    Answer:   Tresa Garter [[id number]]     Follow-up: Return in about 6 months (around 12/31/2017) for Prediabetes.   Clent Demark PA

## 2017-07-02 NOTE — Patient Instructions (Signed)
Hidradenitis Suppurativa Hidradenitis suppurativa is a long-term (chronic) skin disease that starts with blocked sweat glands or hair follicles. Bacteria may grow in these blocked openings of your skin. Hidradenitis suppurativa is like a severe form of acne that develops in areas of your body where acne would be unusual. It is most likely to affect the areas of your body where skin rubs against skin and becomes moist. This includes your:  Underarms.  Groin.  Genital areas.  Buttocks.  Upper thighs.  Breasts.  Hidradenitis suppurativa may start out with small pimples. The pimples can develop into deep sores that break open (rupture) and drain pus. Over time your skin may thicken and become scarred. Hidradenitis suppurativa cannot be passed from person to person. What are the causes? The exact cause of hidradenitis suppurativa is not known. This condition may be due to:  Female and female hormones. The condition is rare before and after puberty.  An overactive body defense system (immune system). Your immune system may overreact to the blocked hair follicles or sweat glands and cause swelling and pus-filled sores.  What increases the risk? You may have a higher risk of hidradenitis suppurativa if you:  Are a woman.  Are between ages 11 and 55.  Have a family history of hidradenitis suppurativa.  Have a personal history of acne.  Are overweight.  Smoke.  Take the drug lithium.  What are the signs or symptoms? The first signs of an outbreak are usually painful skin bumps that look like pimples. As the condition progresses:  Skin bumps may get bigger and grow deeper into the skin.  Bumps under the skin may rupture and drain smelly pus.  Skin may become itchy and infected.  Skin may thicken and scar.  Drainage may continue through tunnels under the skin (fistulas).  Walking and moving your arms can become painful.  How is this diagnosed? Your health care provider may  diagnose hidradenitis suppurativa based on your medical history and your signs and symptoms. A physical exam will also be done. You may need to see a health care provider who specializes in skin diseases (dermatologist). You may also have tests done to confirm the diagnosis. These can include:  Swabbing a sample of pus or drainage from your skin so it can be sent to the lab and tested for infection.  Blood tests to check for infection.  How is this treated? The same treatment will not work for everybody with hidradenitis suppurativa. Your treatment will depend on how severe your symptoms are. You may need to try several treatments to find what works best for you. Part of your treatment may include cleaning and bandaging (dressing) your wounds. You may also have to take medicines, such as the following:  Antibiotics.  Acne medicines.  Medicines to block or suppress the immune system.  A diabetes medicine (metformin) is sometimes used to treat this condition.  For women, birth control pills can sometimes help relieve symptoms.  You may need surgery if you have a severe case of hidradenitis suppurativa that does not respond to medicine. Surgery may involve:  Using a laser to clear the skin and remove hair follicles.  Opening and draining deep sores.  Removing the areas of skin that are diseased and scarred.  Follow these instructions at home:  Learn as much as you can about your disease, and work closely with your health care providers.  Take medicines only as directed by your health care provider.  If you were prescribed   an antibiotic medicine, finish it all even if you start to feel better.  If you are overweight, losing weight may be very helpful. Try to reach and maintain a healthy weight.  Do not use any tobacco products, including cigarettes, chewing tobacco, or electronic cigarettes. If you need help quitting, ask your health care provider.  Do not shave the areas where you  get hidradenitis suppurativa.  Do not wear deodorant.  Wear loose-fitting clothes.  Try not to overheat and get sweaty.  Take a daily bleach bath as directed by your health care provider. ? Fill your bathtub halfway with water. ? Pour in  cup of unscented household bleach. ? Soak for 5-10 minutes.  Cover sore areas with a warm, clean washcloth (compress) for 5-10 minutes. Contact a health care provider if:  You have a flare-up of hidradenitis suppurativa.  You have chills or a fever.  You are having trouble controlling your symptoms at home. This information is not intended to replace advice given to you by your health care provider. Make sure you discuss any questions you have with your health care provider. Document Released: 04/16/2004 Document Revised: 02/08/2016 Document Reviewed: 12/03/2013 Elsevier Interactive Patient Education  2018 [employer] Inc[employer]  

## 2017-07-03 ENCOUNTER — Telehealth: Payer: Self-pay

## 2017-07-03 LAB — COMPREHENSIVE METABOLIC PANEL
ALBUMIN: 4.2 g/dL (ref 3.5–5.5)
ALK PHOS: 122 IU/L — AB (ref 39–117)
ALT: 28 IU/L (ref 0–32)
AST: 24 IU/L (ref 0–40)
Albumin/Globulin Ratio: 1.5 (ref 1.2–2.2)
BUN / CREAT RATIO: 9 (ref 9–23)
BUN: 7 mg/dL (ref 6–20)
Bilirubin Total: <0.2 mg/dL (ref 0.0–1.2)
CO2: 23 mmol/L (ref 20–29)
CREATININE: 0.78 mg/dL (ref 0.57–1.00)
Calcium: 9.8 mg/dL (ref 8.7–10.2)
Chloride: 102 mmol/L (ref 96–106)
GFR calc non Af Amer: 111 mL/min/{1.73_m2} (ref >59)
GFR, EST AFRICAN AMERICAN: 127 mL/min/{1.73_m2} (ref >59)
GLOBULIN, TOTAL: 2.8 g/dL (ref 1.5–4.5)
GLUCOSE: 99 mg/dL (ref 65–99)
Potassium: 4 mmol/L (ref 3.5–5.2)
SODIUM: 141 mmol/L (ref 134–144)
TOTAL PROTEIN: 7 g/dL (ref 6.0–8.5)

## 2017-07-03 NOTE — Telephone Encounter (Signed)
-----   Message from Clent Demark, PA-C sent at 07/03/2017 10:32 AM EDT ----- CMP unremarkable.

## 2017-07-03 NOTE — Telephone Encounter (Signed)
Left patient a message informing of normal CMP. Nat Christen, CMA

## 2017-07-17 ENCOUNTER — Ambulatory Visit (INDEPENDENT_AMBULATORY_CARE_PROVIDER_SITE_OTHER): Payer: Self-pay | Admitting: Physician Assistant

## 2017-07-17 ENCOUNTER — Encounter (INDEPENDENT_AMBULATORY_CARE_PROVIDER_SITE_OTHER): Payer: Self-pay | Admitting: Physician Assistant

## 2017-07-17 ENCOUNTER — Ambulatory Visit (INDEPENDENT_AMBULATORY_CARE_PROVIDER_SITE_OTHER): Payer: Medicaid Other | Admitting: Physician Assistant

## 2017-07-17 VITALS — BP 101/65 | HR 115 | Temp 98.5°F | Ht 62.0 in | Wt 264.2 lb

## 2017-07-17 DIAGNOSIS — Z23 Encounter for immunization: Secondary | ICD-10-CM | POA: Diagnosis not present

## 2017-07-17 DIAGNOSIS — Z0000 Encounter for general adult medical examination without abnormal findings: Secondary | ICD-10-CM | POA: Diagnosis not present

## 2017-07-17 DIAGNOSIS — Z114 Encounter for screening for human immunodeficiency virus [HIV]: Secondary | ICD-10-CM

## 2017-07-17 DIAGNOSIS — L732 Hidradenitis suppurativa: Secondary | ICD-10-CM

## 2017-07-17 HISTORY — DX: Hidradenitis suppurativa: L73.2

## 2017-07-17 NOTE — Progress Notes (Signed)
Subjective:  Patient ID: Elizabeth Howell, female    DOB: 1996-03-27  Age: 21 y.o. MRN: [id number]  CC: annual exam  HPI  Elizabeth Williamsis a 21 y.o.femalewith a PMH of Depression, anxiety, and ADD presents for an annual physical.  Feeling generally well. Takes medications as directed. No complaints or symptoms.    Outpatient Medications Prior to Visit  Medication Sig Dispense Refill  . clindamycin (CLINDAGEL) 1 % gel Apply topically 2 (two) times daily. 30 g 0  . dexmethylphenidate (FOCALIN XR) 20 MG 24 hr capsule Take 25 mg by mouth daily.    Marland Kitchen doxycycline (VIBRA-TABS) 100 MG tablet Take 1 tablet (100 mg total) by mouth 2 (two) times daily. 20 tablet 0  . metFORMIN (GLUCOPHAGE) 500 MG tablet Take 1 tablet (500 mg total) by mouth 2 (two) times daily with a meal. 60 tablet 5  . ranitidine (ZANTAC) 150 MG tablet TAKE 1 TABLET BY MOUTH TWICE A DAY 60 tablet 6  . simvastatin (ZOCOR) 20 MG tablet Take 1 tablet (20 mg total) by mouth at bedtime. 30 tablet 2   No facility-administered medications prior to visit.      ROS Review of Systems  Constitutional: Negative for chills, fever and malaise/fatigue.  Eyes: Negative for blurred vision.  Respiratory: Negative for shortness of breath.   Cardiovascular: Negative for chest pain and palpitations.  Gastrointestinal: Negative for abdominal pain and nausea.  Genitourinary: Negative for dysuria and hematuria.  Musculoskeletal: Negative for joint pain and myalgias.  Skin: Negative for rash.  Neurological: Negative for tingling and headaches.  Psychiatric/Behavioral: Negative for depression. The patient is not nervous/anxious.     Objective:  BP 101/65 (BP Location: Left Arm, Patient Position: Sitting, Cuff Size: Large)   Pulse (!) 115   Temp 98.5 F (36.9 C) (Oral)   Ht 5\' 2"  (1.575 m)   Wt 264 lb 3.2 oz (119.8 kg)   LMP 06/18/2017 (Approximate)   SpO2 97%   BMI 48.32 kg/m   BP/Weight 07/17/2017 07/02/2017 09/17/9415   Systolic BP 408 144 818  Diastolic BP 65 73 69  Wt. (Lbs) 264.2 258.4 -  BMI 48.32 46.51 -      Physical Exam  Constitutional: She is oriented to person, place, and time.  Well developed, obese, NAD, polite  HENT:  Head: Normocephalic and atraumatic.  Eyes: No scleral icterus.  Neck: Normal range of motion. Neck supple. No thyromegaly present.  Cardiovascular: Normal rate, regular rhythm and normal heart sounds.   Pulmonary/Chest: Effort normal and breath sounds normal. No respiratory distress. She has no wheezes. She has no rales.  Abdominal: Soft. Bowel sounds are normal. There is no tenderness.  Musculoskeletal: She exhibits no edema.  UEs, LEs, and back with full aROM.  Lymphadenopathy:    She has no cervical adenopathy.  Neurological: She is alert and oriented to person, place, and time. She has normal reflexes. No cranial nerve deficit. Coordination normal.  Skin: Skin is warm and dry. No rash noted. No erythema. No pallor.  Psychiatric: She has a normal mood and affect. Her behavior is normal. Thought content normal.  Vitals reviewed.    Assessment & Plan:    1. Annual physical exam - Comprehensive metabolic panel - Lipid panel  2. Encounter for screening for HIV - HIV antibody  3. Need for prophylactic vaccination against Streptococcus pneumoniae (pneumococcus) - Pneumococcal conjugate vaccine 13-valent     Follow-up:  PRN  Clent Demark PA

## 2017-07-18 ENCOUNTER — Telehealth (INDEPENDENT_AMBULATORY_CARE_PROVIDER_SITE_OTHER): Payer: Self-pay | Admitting: *Deleted

## 2017-07-18 LAB — LIPID PANEL
CHOL/HDL RATIO: 4 ratio (ref 0.0–4.4)
Cholesterol, Total: 177 mg/dL — ABNORMAL HIGH (ref 100–169)
HDL: 44 mg/dL (ref >39)
LDL Calculated: 96 mg/dL (ref 0–109)
Triglycerides: 186 mg/dL — ABNORMAL HIGH (ref 0–89)
VLDL CHOLESTEROL CAL: 37 mg/dL (ref 5–40)

## 2017-07-18 LAB — COMPREHENSIVE METABOLIC PANEL
A/G RATIO: 1.5 (ref 1.2–2.2)
ALBUMIN: 4.1 g/dL (ref 3.5–5.5)
ALT: 34 IU/L — ABNORMAL HIGH (ref 0–32)
AST: 27 IU/L (ref 0–40)
Alkaline Phosphatase: 118 IU/L — ABNORMAL HIGH (ref 39–117)
BILIRUBIN TOTAL: 0.2 mg/dL (ref 0.0–1.2)
BUN / CREAT RATIO: 10 (ref 9–23)
BUN: 7 mg/dL (ref 6–20)
CHLORIDE: 100 mmol/L (ref 96–106)
CO2: 22 mmol/L (ref 20–29)
Calcium: 9.4 mg/dL (ref 8.7–10.2)
Creatinine, Ser: 0.68 mg/dL (ref 0.57–1.00)
GFR calc non Af Amer: 127 mL/min/{1.73_m2} (ref >59)
GFR, EST AFRICAN AMERICAN: 147 mL/min/{1.73_m2} (ref >59)
Globulin, Total: 2.8 g/dL (ref 1.5–4.5)
Glucose: 128 mg/dL — ABNORMAL HIGH (ref 65–99)
Potassium: 3.9 mmol/L (ref 3.5–5.2)
Sodium: 140 mmol/L (ref 134–144)
TOTAL PROTEIN: 6.9 g/dL (ref 6.0–8.5)

## 2017-07-18 LAB — HIV ANTIBODY (ROUTINE TESTING W REFLEX): HIV SCREEN 4TH GENERATION: NONREACTIVE

## 2017-07-18 NOTE — Telephone Encounter (Signed)
-----   Message from Clent Demark, PA-C sent at 07/18/2017  9:31 AM EDT ----- Cholesterol is much improved over the last time. Continue on simvastatin and please advise to abstain from sweets and sodas.

## 2017-07-21 ENCOUNTER — Ambulatory Visit: Payer: Self-pay | Admitting: Surgery

## 2017-07-21 DIAGNOSIS — L732 Hidradenitis suppurativa: Secondary | ICD-10-CM | POA: Diagnosis not present

## 2017-07-21 NOTE — H&P (View-Only) (Signed)
Elizabeth Howell 07/21/2017 10:21 AM Location: [facility] Patient #: [id number] DOB: Apr 16, 1996 Single / Language: [name] / Race: Black or African American Female  History of Present Illness Elizabeth Moores A. Freedom Peddy MD; 07/21/2017 11:05 AM) Patient words: Patient sent at the request of Elizabeth Fail PA for left axillary hidradenitis. She was seen earlier this year in an area of hidradenitis involving her left axilla. This is managed medically and resolved. There is no area of thickness and a small sinus tract that have not resolved that occasionally flareup requiring antibiotic treatment. The area is about 3 cm x 4 cm. She has no problem with her right axilla this point in time. Her mom is with her today. She is interested in surgical excision of this area to prevent recurrences.  The patient is a 21 year old female.   Problem List/Past Medical (Elizabeth Klecker A. Dezerae Freiberger, MD; 07/21/2017 11:10 AM) DIABETES MELLITUS, CONTROLLED (E11.9) HYPERCHOLESTEREMIA (E78.00)  Allergies (Elizabeth Howell, [place]; 07/21/2017 10:22 AM) No Known Drug Allergies 10/04/2015 Allergies Reconciled  Medication History (Elizabeth Howell, [place]; 07/21/2017 10:24 AM) Sertraline HCl (100MG  Tablet, Oral) Active. RaNITidine HCl (150MG  Tablet, Oral) Active. GuanFACINE HCl ER (2MG  Tablet ER 24HR, Oral) Active. Simvastatin (20MG  Tablet, Oral) Active. Fish Oil + D3 (1000-1000MG -UNIT Capsule, Oral) Active. MetFORMIN HCl ER (MOD) (500MG  Tablet ER 24HR, Oral) Active. Medications Reconciled    Vitals (Elizabeth Howell; 07/21/2017 10:22 AM) 07/21/2017 10:21 AM Weight: 258.8 lb (99th percentile) Height: 62in (19th percentile) Body Surface Area: 2.13 m Body Mass Index: 47.33 kg/m  (99th percentile)  Temp.: 98.54F  Pulse: 98 (Regular)  BP: 125/72 (Sitting, Left Arm, Standard)  Percentiles calculated using CDC data for children 2-20 years.    Physical Exam (Elizabeth Gervin A. Orbin Mayeux MD; 07/21/2017  11:06 AM)  General Mental Status-Alert. General Appearance-Consistent with stated age. Hydration-Well hydrated. Voice-Normal.  Integumentary Note: With these ulcers. 3 cm x 4 cm chronic hidradenitis without acute signs of infection. Remainder the left axilla and running somewhat otherwise clear disease.  Head and Neck Head-normocephalic, atraumatic with no lesions or palpable masses. Trachea-midline. Thyroid Gland Characteristics - normal size and consistency.  Chest and Lung Exam Chest and lung exam reveals -quiet, even and easy respiratory effort with no use of accessory muscles and on auscultation, normal breath sounds, no adventitious sounds and normal vocal resonance. Inspection Chest Wall - Normal. Back - normal.  Cardiovascular Cardiovascular examination reveals -normal heart sounds, regular rate and rhythm with no murmurs and normal pedal pulses bilaterally.  Neurologic Neurologic evaluation reveals -alert and oriented x 3 with no impairment of recent or remote memory. Mental Status-Normal.  Musculoskeletal Normal Exam - Left-Upper Extremity Strength Normal and Lower Extremity Strength Normal. Normal Exam - Right-Upper Extremity Strength Normal and Lower Extremity Strength Normal.    Assessment & Plan (Elizabeth Layne A. 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 The patient desires excision this was discussed with the patient and her mother today. Risks, benefits and other options are discussed. Risks of surgery including bleeding, infection, recurrence, open wounds, chronic wound pain, drainage, numbness from incision, need further procedures and/or treatments.  Current Plans You are being scheduled for surgery- Our schedulers will call you.  You should hear from our office's scheduling department within 5 working days about the  location, date, and time of surgery. We try to make accommodations for patient's preferences in scheduling surgery, but sometimes the OR schedule or the  surgeon's schedule prevents [place] from making those accommodations.  If you have not heard from our office [phone]) in 5 working days, call the office and ask for your surgeon's nurse.  If you have other questions about your diagnosis, plan, or surgery, call the office and ask for your surgeon's nurse.  The pathophysiology of skin & subcutaneous masses was discussed. Natural history risks without surgery were discussed. I recommended surgery to remove the mass. I explained the technique of removal with use of local anesthesia & possible need for more aggressive sedation/anesthesia for patient comfort.  Risks such as bleeding, infection, wound breakdown, heart attack, death, and other risks were discussed. I noted a good likelihood this will help address the problem. Possibility that this will not correct all symptoms was explained. Possibility of regrowth/recurrence of the mass was discussed. We will work to minimize complications. Questions were answered. The patient expresses understanding & wishes to proceed with surgery.  The anatomy and physiology of sweat glands and skin creases was discussed. Pathophysiology of hidradenitis suppurativa was discussed. I stressed good hygiene, avoiding razors and avoiding antiperspirants. Consideration of excision with primary closure versus need to place a drain, open packing, possible skin flaps/grafts was discussed.  Possibility of recurrence was discussed. Risks, benefits, alternatives were discussed. I noted a good likelihood this will help address the problem. Risks of anesthesia and other risks discussed. Questions answered. The patient is considering surgery. They wish to proceed.  Pt Education - CCS Free Text Education/Instructions: discussed with patient and provided information.

## 2017-07-21 NOTE — H&P (Signed)
Elizabeth Howell 07/21/2017 10:21 AM Location: [facility] Patient #: [id number] DOB: 29-Jan-1996 Single / Language: [name] / Race: Black or African American Female  History of Present Illness Marcello Moores A. Xachary Hambly MD; 07/21/2017 11:05 AM) Patient words: Patient sent at the request of [name] PA for left axillary hidradenitis. She was seen earlier this year in an area of hidradenitis involving her left axilla. This is managed medically and resolved. There is no area of thickness and a small sinus tract that have not resolved that occasionally flareup requiring antibiotic treatment. The area is about 3 cm x 4 cm. She has no problem with her right axilla this point in time. Her mom is with her today. She is interested in surgical excision of this area to prevent recurrences.  The patient is a 21 year old female.   Problem List/Past Medical (Sarissa Dern A. Lanaiya Lantry, MD; 07/21/2017 11:10 AM) DIABETES MELLITUS, CONTROLLED (E11.9) HYPERCHOLESTEREMIA (E78.00)  Allergies ([name], [place]; 07/21/2017 10:22 AM) No Known Drug Allergies 10/04/2015 Allergies Reconciled  Medication History ([name], [place]; 07/21/2017 10:24 AM) Sertraline HCl (100MG  Tablet, Oral) Active. RaNITidine HCl (150MG  Tablet, Oral) Active. GuanFACINE HCl ER (2MG  Tablet ER 24HR, Oral) Active. Simvastatin (20MG  Tablet, Oral) Active. Fish Oil + D3 (1000-1000MG -UNIT Capsule, Oral) Active. MetFORMIN HCl ER (MOD) (500MG  Tablet ER 24HR, Oral) Active. Medications Reconciled    Vitals (Tanisha A. Brown RMA; 07/21/2017 10:22 AM) 07/21/2017 10:21 AM Weight: 258.8 lb (99th percentile) Height: 62in (19th percentile) Body Surface Area: 2.13 m Body Mass Index: 47.33 kg/m  (99th percentile)  Temp.: 98.1F  Pulse: 98 (Regular)  BP: 125/72 (Sitting, Left Arm, Standard)  Percentiles calculated using CDC data for children 2-20 years.    Physical Exam (Avika Carbine A. Caylee Vlachos MD; 07/21/2017  11:06 AM)  General Mental Status-Alert. General Appearance-Consistent with stated age. Hydration-Well hydrated. Voice-Normal.  Integumentary Note: With these ulcers. 3 cm x 4 cm chronic hidradenitis without acute signs of infection. Remainder the left axilla and running somewhat otherwise clear disease.  Head and Neck Head-normocephalic, atraumatic with no lesions or palpable masses. Trachea-midline. Thyroid Gland Characteristics - normal size and consistency.  Chest and Lung Exam Chest and lung exam reveals -quiet, even and easy respiratory effort with no use of accessory muscles and on auscultation, normal breath sounds, no adventitious sounds and normal vocal resonance. Inspection Chest Wall - Normal. Back - normal.  Cardiovascular Cardiovascular examination reveals -normal heart sounds, regular rate and rhythm with no murmurs and normal pedal pulses bilaterally.  Neurologic Neurologic evaluation reveals -alert and oriented x 3 with no impairment of recent or remote memory. Mental Status-Normal.  Musculoskeletal Normal Exam - Left-Upper Extremity Strength Normal and Lower Extremity Strength Normal. Normal Exam - Right-Upper Extremity Strength Normal and Lower Extremity Strength Normal.    Assessment & Plan (Emmalina Espericueta A. Aariona Momon MD; 07/21/2017 11:06 AM)  HIDRADENITIS SUPPURATIVA OF LEFT AXILLA (L73.2) Impression: Discussed options of excision versus continued medical management. The patient desires excision this was discussed with the patient and her mother today. Risks, benefits and other options are discussed. Risks of surgery including bleeding, infection, recurrence, open wounds, chronic wound pain, drainage, numbness from incision, need further procedures and/or treatments.  Current Plans You are being scheduled for surgery- Our schedulers will call you.  You should hear from our office's scheduling department within 5 working days about the  location, date, and time of surgery. We try to make accommodations for patient's preferences in scheduling surgery, but sometimes the OR schedule or the  surgeon's schedule prevents [place] from making those accommodations.  If you have not heard from our office [phone]) in 5 working days, call the office and ask for your surgeon's nurse.  If you have other questions about your diagnosis, plan, or surgery, call the office and ask for your surgeon's nurse.  The pathophysiology of skin & subcutaneous masses was discussed. Natural history risks without surgery were discussed. I recommended surgery to remove the mass. I explained the technique of removal with use of local anesthesia & possible need for more aggressive sedation/anesthesia for patient comfort.  Risks such as bleeding, infection, wound breakdown, heart attack, death, and other risks were discussed. I noted a good likelihood this will help address the problem. Possibility that this will not correct all symptoms was explained. Possibility of regrowth/recurrence of the mass was discussed. We will work to minimize complications. Questions were answered. The patient expresses understanding & wishes to proceed with surgery.  The anatomy and physiology of sweat glands and skin creases was discussed. Pathophysiology of hidradenitis suppurativa was discussed. I stressed good hygiene, avoiding razors and avoiding antiperspirants. Consideration of excision with primary closure versus need to place a drain, open packing, possible skin flaps/grafts was discussed.  Possibility of recurrence was discussed. Risks, benefits, alternatives were discussed. I noted a good likelihood this will help address the problem. Risks of anesthesia and other risks discussed. Questions answered. The patient is considering surgery. They wish to proceed.  Pt Education - CCS Free Text Education/Instructions: discussed with patient and provided information.

## 2017-07-24 ENCOUNTER — Other Ambulatory Visit: Payer: Self-pay

## 2017-07-24 ENCOUNTER — Encounter (HOSPITAL_BASED_OUTPATIENT_CLINIC_OR_DEPARTMENT_OTHER): Payer: Self-pay | Admitting: *Deleted

## 2017-07-24 NOTE — Pre-Procedure Instructions (Signed)
To come for anesthesia airway evaluation and to pick up Ensure pre-surgery drink 10 oz. - to drink by 1100 DOS.

## 2017-07-28 NOTE — Progress Notes (Signed)
Ensure pre surgery drink given with instructions to complete by Gamaliel, pt verbalized understanding. Anesthesia consult per Dr. Kalman Shan, will proceed with surgery as scheduled.

## 2017-07-30 ENCOUNTER — Encounter (HOSPITAL_BASED_OUTPATIENT_CLINIC_OR_DEPARTMENT_OTHER): Payer: Self-pay

## 2017-07-30 ENCOUNTER — Ambulatory Visit (HOSPITAL_BASED_OUTPATIENT_CLINIC_OR_DEPARTMENT_OTHER)
Admission: RE | Admit: 2017-07-30 | Discharge: 2017-07-30 | Disposition: A | Discharge status: Home / Self Care | Payer: Medicaid Other | Source: Ambulatory Visit | Attending: Surgery | Admitting: Surgery

## 2017-07-30 ENCOUNTER — Other Ambulatory Visit: Payer: Self-pay

## 2017-07-30 ENCOUNTER — Ambulatory Visit (HOSPITAL_BASED_OUTPATIENT_CLINIC_OR_DEPARTMENT_OTHER): Payer: Medicaid Other | Admitting: Certified Registered"

## 2017-07-30 ENCOUNTER — Encounter (HOSPITAL_BASED_OUTPATIENT_CLINIC_OR_DEPARTMENT_OTHER)
Admission: RE | Disposition: A | Discharge status: Home / Self Care | Payer: Self-pay | Source: Ambulatory Visit | Attending: Surgery

## 2017-07-30 DIAGNOSIS — Z68.41 Body mass index (BMI) pediatric, greater than or equal to 95th percentile for age: Secondary | ICD-10-CM | POA: Insufficient documentation

## 2017-07-30 DIAGNOSIS — Z7984 Long term (current) use of oral hypoglycemic drugs: Secondary | ICD-10-CM | POA: Insufficient documentation

## 2017-07-30 DIAGNOSIS — Z79899 Other long term (current) drug therapy: Secondary | ICD-10-CM | POA: Insufficient documentation

## 2017-07-30 DIAGNOSIS — E119 Type 2 diabetes mellitus without complications: Secondary | ICD-10-CM | POA: Insufficient documentation

## 2017-07-30 DIAGNOSIS — L732 Hidradenitis suppurativa: Secondary | ICD-10-CM | POA: Insufficient documentation

## 2017-07-30 DIAGNOSIS — E7800 Pure hypercholesterolemia, unspecified: Secondary | ICD-10-CM | POA: Diagnosis not present

## 2017-07-30 HISTORY — DX: Hyperlipidemia, unspecified: E78.5

## 2017-07-30 HISTORY — DX: Depression, unspecified: F32.A

## 2017-07-30 HISTORY — DX: Attention-deficit hyperactivity disorder, unspecified type: F90.9

## 2017-07-30 HISTORY — DX: Hidradenitis suppurativa: L73.2

## 2017-07-30 HISTORY — DX: Prediabetes: R73.03

## 2017-07-30 HISTORY — DX: Major depressive disorder, single episode, unspecified: F32.9

## 2017-07-30 HISTORY — PX: HYDRADENITIS EXCISION: SHX5243

## 2017-07-30 HISTORY — DX: Personal history of other diseases of the respiratory system: Z87.09

## 2017-07-30 HISTORY — DX: Nocturia: R35.1

## 2017-07-30 LAB — GLUCOSE, CAPILLARY: Glucose-Capillary: 112 mg/dL — ABNORMAL HIGH (ref 65–99)

## 2017-07-30 LAB — POCT PREGNANCY, URINE: PREG TEST UR: NEGATIVE

## 2017-07-30 SURGERY — EXCISION, HIDRADENITIS, AXILLA
Anesthesia: General | Site: Axilla | Laterality: Left

## 2017-07-30 MED ORDER — CEFAZOLIN SODIUM-DEXTROSE 1-4 GM/50ML-% IV SOLN
INTRAVENOUS | Status: AC
  Filled 2017-07-30: qty 50

## 2017-07-30 MED ORDER — CEFAZOLIN SODIUM-DEXTROSE 2-4 GM/100ML-% IV SOLN
INTRAVENOUS | Status: AC
Start: 2017-07-30 — End: 2017-07-30
  Filled 2017-07-30: qty 100

## 2017-07-30 MED ORDER — ONDANSETRON HCL 4 MG/2ML IJ SOLN
INTRAMUSCULAR | Status: AC
  Filled 2017-07-30: qty 2

## 2017-07-30 MED ORDER — MIDAZOLAM HCL 2 MG/2ML IJ SOLN
INTRAMUSCULAR | Status: AC
  Filled 2017-07-30: qty 4

## 2017-07-30 MED ORDER — FENTANYL CITRATE (PF) 100 MCG/2ML IJ SOLN
INTRAMUSCULAR | Status: AC
  Filled 2017-07-30: qty 2

## 2017-07-30 MED ORDER — CHLORHEXIDINE GLUCONATE CLOTH 2 % EX PADS: 6.0000 | MEDICATED_PAD | Freq: Once | CUTANEOUS | Status: DC

## 2017-07-30 MED ORDER — LIDOCAINE 2% (20 MG/ML) 5 ML SYRINGE
INTRAMUSCULAR | Status: AC
  Filled 2017-07-30: qty 5

## 2017-07-30 MED ORDER — ONDANSETRON HCL 4 MG/2ML IJ SOLN
INTRAMUSCULAR | Status: DC | PRN
  Administered 2017-07-30: 4 mg via INTRAVENOUS

## 2017-07-30 MED ORDER — DEXAMETHASONE SODIUM PHOSPHATE 10 MG/ML IJ SOLN
INTRAMUSCULAR | Status: AC
  Filled 2017-07-30: qty 1

## 2017-07-30 MED ORDER — HYDROCODONE-ACETAMINOPHEN 5-325 MG PO TABS
1.0000 | ORAL_TABLET | Freq: Once | ORAL | Status: AC | PRN
  Administered 2017-07-30: 1 via ORAL

## 2017-07-30 MED ORDER — PROPOFOL 10 MG/ML IV BOLUS
INTRAVENOUS | Status: DC | PRN
  Administered 2017-07-30: 200 mg via INTRAVENOUS

## 2017-07-30 MED ORDER — PROPOFOL 10 MG/ML IV BOLUS
INTRAVENOUS | Status: AC
  Filled 2017-07-30: qty 40

## 2017-07-30 MED ORDER — SUCCINYLCHOLINE CHLORIDE 20 MG/ML IJ SOLN
INTRAMUSCULAR | Status: DC | PRN
  Administered 2017-07-30: 120 mg via INTRAVENOUS

## 2017-07-30 MED ORDER — FENTANYL CITRATE (PF) 100 MCG/2ML IJ SOLN
50.0000 ug | INTRAMUSCULAR | Status: DC | PRN
  Administered 2017-07-30: 100 ug via INTRAVENOUS

## 2017-07-30 MED ORDER — BUPIVACAINE-EPINEPHRINE 0.25% -1:200000 IJ SOLN
INTRAMUSCULAR | Status: DC | PRN
  Administered 2017-07-30: 10 mL

## 2017-07-30 MED ORDER — HYDROCODONE-ACETAMINOPHEN 5-325 MG PO TABS
ORAL_TABLET | ORAL | Status: AC
Start: 2017-07-30 — End: 2017-07-30
  Filled 2017-07-30: qty 1

## 2017-07-30 MED ORDER — CEFAZOLIN SODIUM 10 G IJ SOLR
3.0000 g | INTRAMUSCULAR | Status: AC
  Administered 2017-07-30: 3 g via INTRAVENOUS

## 2017-07-30 MED ORDER — ROCURONIUM BROMIDE 10 MG/ML (PF) SYRINGE
PREFILLED_SYRINGE | INTRAVENOUS | Status: AC
  Filled 2017-07-30: qty 5

## 2017-07-30 MED ORDER — PHENYLEPHRINE HCL 10 MG/ML IJ SOLN
INTRAMUSCULAR | Status: DC | PRN
  Administered 2017-07-30: 120 ug via INTRAVENOUS

## 2017-07-30 MED ORDER — PROMETHAZINE HCL 25 MG/ML IJ SOLN: 6.2500 mg | INTRAMUSCULAR | Status: DC | PRN

## 2017-07-30 MED ORDER — LIDOCAINE HCL (CARDIAC) 20 MG/ML IV SOLN
INTRAVENOUS | Status: DC | PRN
  Administered 2017-07-30: 100 mg via INTRAVENOUS

## 2017-07-30 MED ORDER — SCOPOLAMINE 1 MG/3DAYS TD PT72: 1.0000 | MEDICATED_PATCH | Freq: Once | TRANSDERMAL | Status: DC | PRN

## 2017-07-30 MED ORDER — NEOSTIGMINE METHYLSULFATE 5 MG/5ML IV SOSY
PREFILLED_SYRINGE | INTRAVENOUS | Status: AC
  Filled 2017-07-30: qty 5

## 2017-07-30 MED ORDER — IBUPROFEN 800 MG PO TABS: 800.0000 mg | ORAL_TABLET | Freq: Three times a day (TID) | ORAL | 0 refills | Status: DC | PRN

## 2017-07-30 MED ORDER — BUPIVACAINE-EPINEPHRINE (PF) 0.25% -1:200000 IJ SOLN
INTRAMUSCULAR | Status: AC
  Filled 2017-07-30: qty 30

## 2017-07-30 MED ORDER — HYDROCODONE-ACETAMINOPHEN 5-325 MG PO TABS: 1.0000 | ORAL_TABLET | Freq: Four times a day (QID) | ORAL | 0 refills | Status: DC | PRN

## 2017-07-30 MED ORDER — HYDROMORPHONE HCL 1 MG/ML IJ SOLN: 0.2500 mg | INTRAMUSCULAR | Status: DC | PRN

## 2017-07-30 MED ORDER — LACTATED RINGERS IV SOLN
INTRAVENOUS | Status: DC
  Administered 2017-07-30: 14:00:00 via INTRAVENOUS

## 2017-07-30 MED ORDER — MIDAZOLAM HCL 2 MG/2ML IJ SOLN: 1.0000 mg | INTRAMUSCULAR | Status: DC | PRN

## 2017-07-30 SURGICAL SUPPLY — 40 items
BLADE SURG 15 STRL LF DISP TIS (BLADE) ×1 IMPLANT
BLADE SURG 15 STRL SS (BLADE) ×2
CANISTER SUCT 1200ML W/VALVE (MISCELLANEOUS) ×3 IMPLANT
CHLORAPREP W/TINT 26ML (MISCELLANEOUS) ×3 IMPLANT
COVER BACK TABLE 60X90IN (DRAPES) ×3 IMPLANT
COVER MAYO STAND STRL (DRAPES) ×3 IMPLANT
DECANTER SPIKE VIAL GLASS SM (MISCELLANEOUS) ×3 IMPLANT
DERMABOND ADVANCED (GAUZE/BANDAGES/DRESSINGS) ×2
DERMABOND ADVANCED .7 DNX12 (GAUZE/BANDAGES/DRESSINGS) ×1 IMPLANT
DRAPE LAPAROSCOPIC ABDOMINAL (DRAPES) ×3 IMPLANT
DRAPE LAPAROTOMY 100X72 PEDS (DRAPES) ×3 IMPLANT
DRAPE UTILITY XL STRL (DRAPES) ×3 IMPLANT
ELECT COATED BLADE 2.86 ST (ELECTRODE) ×3 IMPLANT
ELECT REM PT RETURN 9FT ADLT (ELECTROSURGICAL) ×3
ELECTRODE REM PT RTRN 9FT ADLT (ELECTROSURGICAL) ×1 IMPLANT
GLOVE BIOGEL PI IND STRL 6.5 (GLOVE) ×1 IMPLANT
GLOVE BIOGEL PI IND STRL 7.0 (GLOVE) ×1 IMPLANT
GLOVE BIOGEL PI IND STRL 8 (GLOVE) ×1 IMPLANT
GLOVE BIOGEL PI INDICATOR 6.5 (GLOVE) ×2
GLOVE BIOGEL PI INDICATOR 7.0 (GLOVE) ×2
GLOVE BIOGEL PI INDICATOR 8 (GLOVE) ×2
GLOVE ECLIPSE 8.0 STRL XLNG CF (GLOVE) ×3 IMPLANT
GLOVE SURG SS PI 6.5 STRL IVOR (GLOVE) ×3 IMPLANT
GOWN STRL REUS W/ TWL LRG LVL3 (GOWN DISPOSABLE) ×2 IMPLANT
GOWN STRL REUS W/TWL LRG LVL3 (GOWN DISPOSABLE) ×6
NEEDLE HYPO 25X1 1.5 SAFETY (NEEDLE) ×3 IMPLANT
NS IRRIG 1000ML POUR BTL (IV SOLUTION) ×3 IMPLANT
PACK BASIN DAY SURGERY FS (CUSTOM PROCEDURE TRAY) ×3 IMPLANT
PENCIL BUTTON HOLSTER BLD 10FT (ELECTRODE) ×3 IMPLANT
SLEEVE SCD COMPRESS KNEE MED (MISCELLANEOUS) ×3 IMPLANT
SPONGE LAP 4X18 X RAY DECT (DISPOSABLE) ×3 IMPLANT
STAPLER VISISTAT 35W (STAPLE) IMPLANT
SUT MON AB 4-0 PC3 18 (SUTURE) ×3 IMPLANT
SUT VICRYL 3-0 CR8 SH (SUTURE) ×3 IMPLANT
SYR CONTROL 10ML LL (SYRINGE) ×3 IMPLANT
TOWEL OR 17X24 6PK STRL BLUE (TOWEL DISPOSABLE) ×6 IMPLANT
TOWEL OR NON WOVEN STRL DISP B (DISPOSABLE) ×3 IMPLANT
TUBE CONNECTING 20'X1/4 (TUBING) ×1
TUBE CONNECTING 20X1/4 (TUBING) ×2 IMPLANT
YANKAUER SUCT BULB TIP NO VENT (SUCTIONS) ×3 IMPLANT

## 2017-07-30 NOTE — Anesthesia Preprocedure Evaluation (Addendum)
Anesthesia Evaluation  Patient identified by MRN, date of birth, ID band Patient awake    Reviewed: Allergy & Precautions, NPO status , Patient's Chart, lab work & pertinent test results  Airway Mallampati: I  TM Distance: >3 FB Neck ROM: Full    Dental no notable dental hx.    Pulmonary neg pulmonary ROS,    breath sounds clear to auscultation       Cardiovascular  Rhythm:Regular Rate:Normal     Neuro/Psych    GI/Hepatic negative GI ROS, Neg liver ROS,   Endo/Other  Morbid obesityPrediabetes noted in chart  Renal/GU negative Renal ROS     Musculoskeletal   Abdominal   Peds  Hematology   Anesthesia Other Findings   Reproductive/Obstetrics                            Anesthesia Physical Anesthesia Plan  ASA: II  Anesthesia Plan: General   Post-op Pain Management:    Induction: Intravenous  PONV Risk Score and Plan: 4 or greater and Ondansetron, Dexamethasone, Midazolam and Treatment may vary due to age or medical condition  Airway Management Planned: Oral ETT  Additional Equipment:   Intra-op Plan:   Post-operative Plan: Extubation in OR  Informed Consent: I have reviewed the patients History and Physical, chart, labs and discussed the procedure including the risks, benefits and alternatives for the proposed anesthesia with the patient or authorized representative who has indicated his/her understanding and acceptance.   Dental advisory given  Plan Discussed with: CRNA  Anesthesia Plan Comments:         Anesthesia Quick Evaluation

## 2017-07-30 NOTE — Discharge Instructions (Signed)
GENERAL SURGERY: POST OP INSTRUCTIONS ° °###################################################################### ° °EAT °Gradually transition to a high fiber diet with a fiber supplement over the next few weeks after discharge.  Start with a pureed / full liquid diet (see below) ° °WALK °Walk an hour a day.  Control your pain to do that.   ° °CONTROL PAIN °Control pain so that you can walk, sleep, tolerate sneezing/coughing, go up/down stairs. ° °HAVE A BOWEL MOVEMENT DAILY °Keep your bowels regular to avoid problems.  OK to try a laxative to override constipation.  OK to use an antidairrheal to slow down diarrhea.  Call if not better after 2 tries ° °CALL IF YOU HAVE PROBLEMS/CONCERNS °Call if you are still struggling despite following these instructions. °Call if you have concerns not answered by these instructions ° °###################################################################### ° ° ° °1. DIET: Follow a light bland diet the first 24 hours after arrival home, such as soup, liquids, crackers, etc.  Be sure to include lots of fluids daily.  Avoid fast food or heavy meals as your are more likely to get nauseated.   °2. Take your usually prescribed home medications unless otherwise directed. °3. PAIN CONTROL: °a. Pain is best controlled by a usual combination of three different methods TOGETHER: °i. Ice/Heat °ii. Over the counter pain medication °iii. Prescription pain medication °b. Most patients will experience some swelling and bruising around the incisions.  Ice packs or heating pads (30-60 minutes up to 6 times a day) will help. Use ice for the first few days to help decrease swelling and bruising, then switch to heat to help relax tight/sore spots and speed recovery.  Some people prefer to use ice alone, heat alone, alternating between ice & heat.  Experiment to what works for you.  Swelling and bruising can take several weeks to resolve.   °c. It is helpful to take an over-the-counter pain medication  regularly for the first few weeks.  Choose one of the following that works best for you: °i. Naproxen (Aleve, etc)  Two 220mg tabs twice a day °ii. Ibuprofen (Advil, etc) Three 200mg tabs four times a day (every meal & bedtime) °iii. Acetaminophen (Tylenol, etc) 500-650mg four times a day (every meal & bedtime) °d. A  prescription for pain medication (such as oxycodone, hydrocodone, etc) should be given to you upon discharge.  Take your pain medication as prescribed.  °i. If you are having problems/concerns with the prescription medicine (does not control pain, nausea, vomiting, rash, itching, etc), please call us [phone] to see if we need to switch you to a different pain medicine that will work better for you and/or control your side effect better. °ii. If you need a refill on your pain medication, please contact your pharmacy.  They will contact our office to request authorization. Prescriptions will not be filled after 5 pm or on week-ends. °4. Avoid getting constipated.  Between the surgery and the pain medications, it is common to experience some constipation.  Increasing fluid intake and taking a fiber supplement (such as Metamucil, Citrucel, FiberCon, MiraLax, etc) 1-2 times a day regularly will usually help prevent this problem from occurring.  A mild laxative (prune juice, Milk of Magnesia, MiraLax, etc) should be taken according to package directions if there are no bowel movements after 48 hours.   °5. Wash / shower every day.  You may shower over the dressings as they are waterproof.  Continue to shower over incision(s) after the dressing is off. °6. Remove your waterproof bandages   5 days after surgery.  You may leave the incision open to air.  You may have skin tapes (Steri Strips) covering the incision(s).  Leave them on until one week, then remove.  You may replace a dressing/Band-Aid to cover the incision for comfort if you wish.  ° ° ° ° °7. ACTIVITIES as tolerated:   °a. You may resume  regular (light) daily activities beginning the next day--such as daily self-care, walking, climbing stairs--gradually increasing activities as tolerated.  If you can walk 30 minutes without difficulty, it is safe to try more intense activity such as jogging, treadmill, bicycling, low-impact aerobics, swimming, etc. °b. Save the most intensive and strenuous activity for last such as sit-ups, heavy lifting, contact sports, etc  Refrain from any heavy lifting or straining until you are off narcotics for pain control.   °c. DO NOT PUSH THROUGH PAIN.  Let pain be your guide: If it hurts to do something, don't do it.  Pain is your body warning you to avoid that activity for another week until the pain goes down. °d. You may drive when you are no longer taking prescription pain medication, you can comfortably wear a seatbelt, and you can safely maneuver your car and apply brakes. °e. You may have sexual intercourse when it is comfortable.  °8. FOLLOW UP in our office °a. Please call [facility] at [phone] to set up an appointment to see your surgeon in the office for a follow-up appointment approximately 2-3 weeks after your surgery. °b. Make sure that you call for this appointment the day you arrive home to insure a convenient appointment time. °9. IF YOU HAVE DISABILITY OR FAMILY LEAVE FORMS, BRING THEM TO THE OFFICE FOR PROCESSING.  DO NOT GIVE THEM TO YOUR DOCTOR. ° ° °WHEN TO CALL US [phone]: °1. Poor pain control °2. Reactions / problems with new medications (rash/itching, nausea, etc)  °3. Fever over 101.5 F (38.5 C) °4. Worsening swelling or bruising °5. Continued bleeding from incision. °6. Increased pain, redness, or drainage from the incision °7. Difficulty breathing / swallowing ° ° The clinic staff is available to answer your questions during regular business hours (8:30am-5pm).  Please don’t hesitate to call and ask to speak to one of our nurses for clinical concerns.  ° If you have a medical emergency,  go to the nearest emergency room or call 911. ° A surgeon from [facility] is always on call at the hospitals ° ° °[facility] °[address], [address], [place], [place]  [address] ? °MAIN: [phone] ? TOLL FREE: [phone] ?  °FAX [phone] °[email or web address] ° ° °Post Anesthesia Home Care Instructions ° °Activity: °Get plenty of rest for the remainder of the day. A responsible individual must stay with you for 24 hours following the procedure.  °For the next 24 hours, DO NOT: °-Drive a car °-Operate machinery °-Drink alcoholic beverages °-Take any medication unless instructed by your physician °-Make any legal decisions or sign important papers. ° °Meals: °Start with liquid foods such as gelatin or soup. Progress to regular foods as tolerated. Avoid greasy, spicy, heavy foods. If nausea and/or vomiting occur, drink only clear liquids until the nausea and/or vomiting subsides. Call your physician if vomiting continues. ° °Special Instructions/Symptoms: °Your throat may feel dry or sore from the anesthesia or the breathing tube placed in your throat during surgery. If this causes discomfort, gargle with warm salt water. The discomfort should disappear within 24 hours. ° °  If you had a scopolamine patch placed behind your ear for the management of post- operative nausea and/or vomiting: ° °1. The medication in the patch is effective for 72 hours, after which it should be removed.  Wrap patch in a tissue and discard in the trash. Wash hands thoroughly with soap and water. °2. You may remove the patch earlier than 72 hours if you experience unpleasant side effects which may include dry mouth, dizziness or visual disturbances. °3. Avoid touching the patch. Wash your hands with soap and water after contact with the patch. °  ° ° °

## 2017-07-30 NOTE — Anesthesia Postprocedure Evaluation (Signed)
Anesthesia Post Note  Patient: Elizabeth Howell  Procedure(s) Performed: EXCISION LEFT AXILLA HIDRADENITIS (Left Axilla)     Patient location during evaluation: PACU Anesthesia Type: General Level of consciousness: awake and alert Pain management: pain level controlled Vital Signs Assessment: post-procedure vital signs reviewed and stable Respiratory status: spontaneous breathing, nonlabored ventilation, respiratory function stable and patient connected to nasal cannula oxygen Cardiovascular status: blood pressure returned to baseline and stable Postop Assessment: no apparent nausea or vomiting Anesthetic complications: no    Last Vitals:  Vitals:   07/30/17 1515 07/30/17 1553  BP: 120/76 113/66  Pulse: (!) 110 (!) 112  Resp: 14 18  Temp:  37.2 C  SpO2: 100% 100%    Last Pain:  Vitals:   07/30/17 1553  TempSrc:   PainSc: 4                  Gary Bultman,JAMES TERRILL

## 2017-07-30 NOTE — Interval H&P Note (Signed)
History and Physical Interval Note:  07/30/2017 1:28 PM  Elizabeth Howell  has presented today for surgery, with the diagnosis of HIDRADENITIS LEFT AXILLA  The various methods of treatment have been discussed with the patient and family. After consideration of risks, benefits and other options for treatment, the patient has consented to  Procedure(s): EXCISION LEFT AXILLA HIDRADENITIS (Left) as a surgical intervention .  The patient's history has been reviewed, patient examined, no change in status, stable for surgery.  I have reviewed the patient's chart and labs.  Questions were answered to the patient's satisfaction.     [name]

## 2017-07-30 NOTE — Transfer of Care (Signed)
Immediate Anesthesia Transfer of Care Note  Patient: Elizabeth Howell  Procedure(s) Performed: EXCISION LEFT AXILLA HIDRADENITIS (Left Axilla)  Patient Location: PACU  Anesthesia Type:General  Level of Consciousness: awake, alert  and oriented  Airway & Oxygen Therapy: Patient Spontanous Breathing and Patient connected to face mask oxygen  Post-op Assessment: Report given to RN and Post -op Vital signs reviewed and stable  Post vital signs: Reviewed and stable  Last Vitals:  Vitals:   07/30/17 1257 07/30/17 1452  BP: (!) 102/51 (!) 89/41  Pulse: (!) 104 (!) (P) 117  Resp: 18 (P) 18  Temp: 36.8 C (P) 36.8 C  SpO2: 100% 100%    Last Pain:  Vitals:   07/30/17 1257  TempSrc: Oral  PainSc: 2          Complications: No apparent anesthesia complications

## 2017-07-30 NOTE — Anesthesia Procedure Notes (Signed)
Procedure Name: Intubation Performed by: Verita Lamb, CRNA Pre-anesthesia Checklist: Patient identified, Emergency Drugs available, Suction available, Patient being monitored and Timeout performed Patient Re-evaluated:Patient Re-evaluated prior to induction Preoxygenation: Pre-oxygenation with 100% oxygen Induction Type: IV induction Ventilation: Mask ventilation without difficulty Laryngoscope Size: Mac and 3 Grade View: Grade I Tube type: Oral Tube size: 7.0 mm Number of attempts: 1 Airway Equipment and Method: Stylet Placement Confirmation: ETT inserted through vocal cords under direct vision,  positive ETCO2,  CO2 detector and breath sounds checked- equal and bilateral Secured at: 22 cm Tube secured with: Tape Dental Injury: Teeth and Oropharynx as per pre-operative assessment  Comments: glidescope on hand as back up, smooth iv induction (pt placed in trendelenberg with HOB notched back in sniffing position prior to induction).  Easy mask with oral airway once chest retracted by anesthesiologist.  rsi induction, grade I view with mac 3 blade

## 2017-07-30 NOTE — Op Note (Signed)
Preoperative diagnosis: Hidradenitis left axilla measuring 7 x 6 cm  Postoperative diagnosis: Same  Procedure: Excision hidradenitis left axilla with intermediate closure  Surgeon: Erroll Luna MD  Anesthesia: LMA with local  EBL: Minimal  Specimen: Left axillary cyst tissue  Drains: None  IV fluids Per anesthesia record  Indications for procedure: The patient presents for excision of left axillary hidradenitis.  Risk, benefits and other options discussed.The procedure has been discussed with the patient.  Alternative therapies have been discussed with the patient.  Operative risks include bleeding,  Infection,  Organ injury,  Nerve injury,  Blood vessel injury,  DVT,  Pulmonary embolism,  Death,  And possible reoperation.  Medical management risks include worsening of present situation.  The success of the procedure is 50 -90 % at treating patients symptoms.  The patient understands and agrees to proceed.   Description of procedure: The patient was met in the holding area.  Left axilla was marked as correct side.  Questions were answered.  She is taken back to the operating room and placed upon the OR table.  After timeout left axilla was prepped and draped in sterile fashion.  This was correct side.  Local anesthetic was infiltrated.  An area of 7 cm x 5 cm of hidradenitis was excised.  The wound was closed in 2 layers with intermediate closure.  Hemostasis achieved.  All final counts found to be correct.  Patient was awoke extubated taken recovery in satisfactory condition.

## 2017-07-31 ENCOUNTER — Encounter (HOSPITAL_BASED_OUTPATIENT_CLINIC_OR_DEPARTMENT_OTHER): Payer: Self-pay | Admitting: Surgery

## 2017-08-15 NOTE — Telephone Encounter (Signed)
-----   Message from Clent Demark, PA-C sent at 07/18/2017  9:31 AM EDT ----- Cholesterol is much improved over the last time. Continue on simvastatin and please advise to abstain from sweets and sodas.

## 2017-08-15 NOTE — Telephone Encounter (Signed)
MA unable to reach patient due to ringing continuously with no answer. Please inform patient of !!!Cholesterol is much improved. Continue simvastatin and limit from sweet and carbs!!!

## 2017-08-25 ENCOUNTER — Other Ambulatory Visit (INDEPENDENT_AMBULATORY_CARE_PROVIDER_SITE_OTHER): Payer: Self-pay | Admitting: "Endocrinology

## 2017-08-25 DIAGNOSIS — R1013 Epigastric pain: Secondary | ICD-10-CM

## 2017-10-01 ENCOUNTER — Other Ambulatory Visit (INDEPENDENT_AMBULATORY_CARE_PROVIDER_SITE_OTHER): Payer: Self-pay | Admitting: "Endocrinology

## 2017-10-01 DIAGNOSIS — R1013 Epigastric pain: Secondary | ICD-10-CM

## 2017-10-10 DIAGNOSIS — I10 Essential (primary) hypertension: Secondary | ICD-10-CM

## 2017-10-27 ENCOUNTER — Other Ambulatory Visit (INDEPENDENT_AMBULATORY_CARE_PROVIDER_SITE_OTHER): Payer: Self-pay | Admitting: "Endocrinology

## 2017-10-27 DIAGNOSIS — R1013 Epigastric pain: Secondary | ICD-10-CM

## 2017-11-12 ENCOUNTER — Other Ambulatory Visit (INDEPENDENT_AMBULATORY_CARE_PROVIDER_SITE_OTHER): Payer: Self-pay | Admitting: "Endocrinology

## 2017-11-12 DIAGNOSIS — R1013 Epigastric pain: Secondary | ICD-10-CM

## 2017-11-17 ENCOUNTER — Other Ambulatory Visit (INDEPENDENT_AMBULATORY_CARE_PROVIDER_SITE_OTHER): Payer: Self-pay | Admitting: "Endocrinology

## 2017-11-17 DIAGNOSIS — R1013 Epigastric pain: Secondary | ICD-10-CM

## 2017-11-23 ENCOUNTER — Other Ambulatory Visit (INDEPENDENT_AMBULATORY_CARE_PROVIDER_SITE_OTHER): Payer: Self-pay | Admitting: Physician Assistant

## 2017-11-23 DIAGNOSIS — E785 Hyperlipidemia, unspecified: Secondary | ICD-10-CM

## 2017-11-24 NOTE — Telephone Encounter (Signed)
FWD to PCP. Salwa Bai S Kysa Calais, CMA  

## 2017-11-26 ENCOUNTER — Telehealth (INDEPENDENT_AMBULATORY_CARE_PROVIDER_SITE_OTHER): Payer: Self-pay | Admitting: "Endocrinology

## 2017-11-26 ENCOUNTER — Other Ambulatory Visit (INDEPENDENT_AMBULATORY_CARE_PROVIDER_SITE_OTHER): Payer: Self-pay | Admitting: *Deleted

## 2017-11-26 DIAGNOSIS — R1013 Epigastric pain: Secondary | ICD-10-CM

## 2017-11-26 MED ORDER — RANITIDINE HCL 150 MG PO TABS: 150.0000 mg | ORAL_TABLET | Freq: Two times a day (BID) | ORAL | 0 refills | Status: DC

## 2017-11-26 NOTE — Telephone Encounter (Signed)
°  Who's calling (name and relationship to patient) : Shirlean Mylar [facility]) Best contact number: Contact husband: [phone] Provider they see: Dr. Tobe Sos Reason for call: Shirlean Mylar came to office. Stated pt was out of refills and could not get more from pharmacy due to needing to schedule a f/u with Dr. Tobe Sos. I scheduled appt for 03/26. Can pt be prescribed enough medication to last her until her appt?     PRESCRIPTION REFILL ONLY  Name of prescription: Ranitidine Pharmacy: [facility]  [place]

## 2017-11-26 NOTE — Telephone Encounter (Signed)
Spoke to guardians husband, advised medication sent to pharmacy.

## 2017-12-09 ENCOUNTER — Ambulatory Visit (INDEPENDENT_AMBULATORY_CARE_PROVIDER_SITE_OTHER): Payer: Medicaid Other | Admitting: "Endocrinology

## 2017-12-09 ENCOUNTER — Encounter (INDEPENDENT_AMBULATORY_CARE_PROVIDER_SITE_OTHER): Payer: Self-pay | Admitting: "Endocrinology

## 2017-12-09 VITALS — BP 116/78 | HR 86 | Ht 62.99 in | Wt 256.6 lb

## 2017-12-09 DIAGNOSIS — E049 Nontoxic goiter, unspecified: Secondary | ICD-10-CM | POA: Diagnosis not present

## 2017-12-09 DIAGNOSIS — R7303 Prediabetes: Secondary | ICD-10-CM | POA: Diagnosis not present

## 2017-12-09 DIAGNOSIS — R7401 Elevation of levels of liver transaminase levels: Secondary | ICD-10-CM

## 2017-12-09 DIAGNOSIS — R748 Abnormal levels of other serum enzymes: Secondary | ICD-10-CM

## 2017-12-09 DIAGNOSIS — R1013 Epigastric pain: Secondary | ICD-10-CM | POA: Diagnosis not present

## 2017-12-09 DIAGNOSIS — E782 Mixed hyperlipidemia: Secondary | ICD-10-CM

## 2017-12-09 DIAGNOSIS — I10 Essential (primary) hypertension: Secondary | ICD-10-CM | POA: Diagnosis not present

## 2017-12-09 DIAGNOSIS — E063 Autoimmune thyroiditis: Secondary | ICD-10-CM

## 2017-12-09 DIAGNOSIS — R740 Nonspecific elevation of levels of transaminase and lactic acid dehydrogenase [LDH]: Secondary | ICD-10-CM | POA: Diagnosis not present

## 2017-12-09 DIAGNOSIS — L83 Acanthosis nigricans: Secondary | ICD-10-CM | POA: Diagnosis not present

## 2017-12-09 LAB — COMPREHENSIVE METABOLIC PANEL
AG RATIO: 1.5 (calc) (ref 1.0–2.5)
ALBUMIN MSPROF: 4.1 g/dL (ref 3.6–5.1)
ALT: 25 U/L (ref 5–32)
AST: 21 U/L (ref 12–32)
Alkaline phosphatase (APISO): 91 U/L (ref 47–176)
BILIRUBIN TOTAL: 0.4 mg/dL (ref 0.2–1.1)
BUN / CREAT RATIO: 8 (calc) (ref 6–22)
BUN: 6 mg/dL — ABNORMAL LOW (ref 7–20)
CHLORIDE: 102 mmol/L (ref 98–110)
CO2: 23 mmol/L (ref 20–32)
Calcium: 9.5 mg/dL (ref 8.9–10.4)
Creat: 0.78 mg/dL (ref 0.50–1.00)
GLOBULIN: 2.8 g/dL (ref 2.0–3.8)
GLUCOSE: 104 mg/dL — AB (ref 65–99)
POTASSIUM: 4.1 mmol/L (ref 3.8–5.1)
SODIUM: 137 mmol/L (ref 135–146)
TOTAL PROTEIN: 6.9 g/dL (ref 6.3–8.2)

## 2017-12-09 LAB — POCT GLUCOSE (DEVICE FOR HOME USE): POC Glucose: 120 mg/dl — AB (ref 70–99)

## 2017-12-09 LAB — T4, FREE: Free T4: 1 ng/dL (ref 0.8–1.4)

## 2017-12-09 LAB — POCT GLYCOSYLATED HEMOGLOBIN (HGB A1C): Hemoglobin A1C: 5.6

## 2017-12-09 LAB — T3, FREE: T3, Free: 3.6 pg/mL (ref 3.0–4.7)

## 2017-12-09 LAB — TSH: TSH: 1.14 mIU/L

## 2017-12-09 NOTE — Progress Notes (Signed)
Subjective:  Patient Name: Elizabeth Howell Date of Birth: [birth year]  MRN: [id number]  Elizabeth Howell  presents to the office today for follow up evaluation and management of her obesity and related issues.    HISTORY OF PRESENT ILLNESS:   Elizabeth Howell is a 22 y.o. African-American young lady.   Elizabeth Howell was unaccompanied.    34. Elizabeth Howell was seen for her initial pediatric endocrine evaluation on 11/25/14:  A. Obesity:   1). Elizabeth Howell was above the 97% for weight at age 47, was down to the 96% briefly at age 62, but had been increasingly growing further above and away from the 97% since then. During the same time period her height increased to about the 85% at age 65-1/2, but had plateaued since age 34.  Elizabeth Howell developed acanthosis nigricans at least 5 years prior. Elizabeth Howell had menarche about age 75.     2). Lab tests on 06/01/14 showed a HbA1c of 6.1%.   3). Elizabeth Howell wanted to be slimmer. Elizabeth Howell was tired of being picked on for being fat. At the same time Elizabeth Howell wanted to eat what Elizabeth Howell wanted when Elizabeth Howell wanted and did not like to exercise.   B. Pertinent past medical history:   1). Medical problems: Asthma in past   2). Surgeries: None   3). Allergies: No known medication allergies; No known environmental allergies   4). GYN: Menarche at age 42-11. LMP a few days ago. Periods were regular.    5). Psych: ADHD and depression:    6). Medications: Zoloft and Vyvanse  C. Pertinent family history - Little is known about dad's FH.   1). Obesity: Mother was heavy. Mom had a pituitary tumor. Elizabeth Howell later had cardiac arrest. Her maternal great grandmother was also heavy. Elizabeth Howell looked like her mom and [name].   2). DM: None   3). Thyroid disease: None   4). ASCVD: None except below.   5). Cancers: None   6). Maternal grandmother had lupus and died of heart failure.  D. Lifestyle:   1). Diet: Lots of carbs, fast food. The maternal grandfather liked his carbs and liked to have [place] and others join him for carb  treats.    2). Physical activity: None  2. Elizabeth Howell's last [facility] visit occurred on 01/13/17. At that visit I continued her ranitidine. When I tried to start her on metformin, 500 mg, twice daily, Elizabeth Howell had substantial GI adverse effects, so we reduced the dose to 250 mg, twice daily.   A. In the interim Elizabeth Howell has been healthy. Elizabeth Howell had an I&D of an infected cyst below her left axilla in November 2018.   B.  Elizabeth Howell still takes the 250 mg of metformin and the 150 mg of ranitidine, twice daily..    C. Elizabeth Howell has been doing "pretty OK" emotionally, so no longer sees her therapist at [facility].  D. Elizabeth Howell remains on sertraline and Focalin. Elizabeth Howell is also taking simvastatin, 20 mg/day, fish oil capsules, and vitamin D3, 1000 IU/day.  3. Pertinent Review of Systems:  Constitutional: The patient feels "pretty good". Her energy level is "pretty good".  Eyes: Vision is good. There are no significant eye complaints. Neck: The patient has not had any recent swelling and pain in her thyroid area. Elizabeth Howell rarely has any left posterior neck pains.   Heart: Heart rate increases with exercise or other physical activity. The patient has no complaints of palpitations, irregular heat beats, chest pain, or chest pressure. Gastrointestinal: Elizabeth Howell does not have as much belly  hunger. Elizabeth Howell denies any upset stomach, stomach pains, diarrhea, or constipation. Arms and hands: No cramps Legs: Muscle mass and strength seem normal. There are no complaints of numbness, tingling, burning, or pain. No edema is noted. Feet: There are no obvious foot problems. There are no complaints of numbness, tingling, burning, or pain. No edema is noted. GYN: LMP was early this month. Periods are regular. Psych: Her depression and anxiety are better.     PAST MEDICAL, FAMILY, AND SOCIAL HISTORY:  Past Medical History:  Diagnosis Date  . ADHD (attention deficit hyperactivity disorder)    no current med.  . Depression   . Hidradenitis  suppurativa of left axilla 07/2017  . History of asthma    no current med.  . Hyperlipidemia   . Nocturia   . Pre-diabetes     Family History  Problem Relation Age of Onset  . Hypertension Maternal Grandfather   . Diabetes Other      Current Outpatient Medications:  .  cholecalciferol (VITAMIN D) 1000 units tablet, Take 1,000 Units daily by mouth., Disp: , Rfl:  .  metFORMIN (GLUCOPHAGE) 500 MG tablet, Take daily after supper by mouth., Disp: , Rfl:  .  Omega-3 Fatty Acids (FISH OIL PO), Take by mouth., Disp: , Rfl:  .  ranitidine (ZANTAC) 150 MG tablet, Take 1 tablet (150 mg total) by mouth 2 (two) times daily., Disp: 60 tablet, Rfl: 0 .  sertraline (ZOLOFT) 100 MG tablet, Take 100 mg daily by mouth., Disp: , Rfl:  .  simvastatin (ZOCOR) 20 MG tablet, TAKE 1 TABLET BY MOUTH EVERYDAY AT BEDTIME, Disp: 30 tablet, Rfl: 2 .  HYDROcodone-acetaminophen (NORCO/VICODIN) 5-325 MG tablet, Take 1-2 tablets every 6 (six) hours as needed by mouth for moderate pain. (Patient not taking: Reported on 12/09/2017), Disp: 10 tablet, Rfl: 0 .  ibuprofen (ADVIL,MOTRIN) 800 MG tablet, Take 1 tablet (800 mg total) every 8 (eight) hours as needed by mouth. (Patient not taking: Reported on 12/09/2017), Disp: 30 tablet, Rfl: 0  Allergies as of 12/09/2017  . (No Known Allergies)    1. School and Family: Elizabeth Howell lives with her maternal grandfather, step-grandmother, and half-brother.  The maternal grandfather and step grandmother were the legal guardians and de facto parents for Elizabeth Howell and her brother. Elizabeth Howell graduated from high school in August 2018. Elizabeth Howell will start a new job at [employer] soon.  2. Activities: Elizabeth Howell likes to read and to listen to music. Elizabeth Howell walks almost every day for about 10-20 minutes. 3. Smoking, alcohol, or drugs: None 4. Primary Care Provider: Mr. [name], PA, at the [facility].  5. [facility]: Elizabeth Howell is no longer going  there.    REVIEW OF SYSTEMS: There are no other significant problems involving Elizabeth Howell's other body systems.   Objective:  Vital Signs:  BP 116/78   Pulse 86   Ht 5' 2.99" (1.6 m)   Wt 256 lb 9.6 oz (116.4 kg)   BMI 45.47 kg/m    Ht Readings from Last 3 Encounters:  12/09/17 5' 2.99" (1.6 m) (30 %, Z= -0.51)*  07/30/17 5' 2"  (1.575 m) (19 %, Z= -0.90)*  07/17/17 5' 2"  (1.575 m) (19 %, Z= -0.90)*   * Growth percentiles are based on CDC (Girls, 2-20 Years) data.   Wt Readings from Last 3 Encounters:  12/09/17 256 lb 9.6 oz (116.4 kg) (>99 %, Z= 2.57)*  07/30/17 261 lb 9.6 oz (118.7 kg) (>99 %, Z= 2.58)*  07/17/17  264 lb 3.2 oz (119.8 kg) (>99 %, Z= 2.60)*   * Growth percentiles are based on CDC (Girls, 2-20 Years) data.   HC Readings from Last 3 Encounters:  No data found for [facility]   Body surface area is 2.27 meters squared.  30 %ile (Z= -0.51) based on CDC (Girls, 2-20 Years) Stature-for-age data based on Stature recorded on 12/09/2017. >99 %ile (Z= 2.57) based on CDC (Girls, 2-20 Years) weight-for-age data using vitals from 12/09/2017.   PHYSICAL EXAM:  Constitutional: The patient appears healthy, but even more morbidly obese. Elizabeth Howell has gained another 8 pounds since her last visit, equivalent to a net gain of about 85 calories per day. Elizabeth Howell is alert and bright today. Her affect and insight are normal. Elizabeth Howell was more engaged today.   Face: The face appears normal.  Eyes: There is no obvious arcus or proptosis. Moisture appears normal. Mouth: The oropharynx and tongue appear normal. Oral moisture is normal. There is no oral hyperpigmentation. Neck: The neck appears to be visibly enlarged. No carotid bruits are noted. The thyroid gland is still enlarged, but smaller today, at about 21 grams in size. The consistency of the thyroid gland is full, but softer than at her last visit. The thyroid gland is not tender to palpation. Elizabeth Howell has 3+ circumferential acanthosis nigricans.  Lungs: The  lungs are clear to auscultation. Air movement is good. Heart: Heart rate and rhythm are regular. Heart sounds S1 and S2 are normal. I did not appreciate any pathologic cardiac murmurs. Abdomen: The abdomen is quite enlarged. Bowel sounds are normal. There is no obvious hepatomegaly, splenomegaly, or other mass effect.  Arms: Muscle size and bulk are normal for age. Hands: There is no obvious tremor. Phalangeal and metacarpophalangeal joints are normal. Palmar muscles are normal. Palmar skin is normal. Palmar moisture is also normal. There is no palmar hyperpigmentation Legs: Muscles appear normal for age. No edema is present. Neurologic: Strength is normal for age in both the upper and lower extremities. Muscle tone is normal. Sensation to touch is normal in both legs.   Skin: Elizabeth Howell has multiple dark striae and faded light striae of her lateral sides.   LAB DATA:  Results for orders placed or performed in visit on 12/09/17 (from the past 504 hour(s))  POCT Glucose (Device for Home Use)   Collection Time: 12/09/17 10:24 AM  Result Value Ref Range   Glucose Fasting, POC  70 - 99 mg/dL   POC Glucose 120 (A) 70 - 99 mg/dl    Labs 12/09/17: HbA1c 5.6%, CBG 120  Labs 07/17/17: Cholesterol 177, triglycerides 186, HDL 44, LDL 96; CMP normal for age, except ALT 34 (ref 0-32)  Labs 07/02/17: HbA1c 6.0%  Labs 01/13/17: HbA1c 5.9%, CBG 128; PTH 31, calcium 9.4, 25-OH vitamin D 31  Labs 11/18/16: TSH 2.85, T4 5.0; CBC normal; CMP normal, except ALP 107 (ref 43-101); cholesterol 288, triglycerides 145, HDL 39, LDL 220  Labs 08/27/16: HbA1c 6.0%  Labs 06/21/15: HbA1c 5.6%  Labs 11/25/14: HbA1c 5.5%; TSH 0.993, free T4 1.08, free T3 3.1, TPO antibody <1, anti-thyroglobulin antibody 1; C-peptide 1.81  Labs 08/16/14: CMP normal; ESR 21 (0-22); CBC normal  Labs: 06/01/14: HbA1c 6.1%, cholesterol 258, triglycerides 151, HDL 48, LDL 180; 25-OH vitamin D 39; TSH 1.743; CMP normal    Assessment and Plan:    ASSESSMENT:  1-3. Morbid obesity/insulin resistance/hyperinsulinemia:   A. Her overly fat adipose cells produce excessive amounts of harmful cytokines. Some cytokines cause hypertension. Some  cytokines cause excess resistance to insulin. Her young beta cells compensate by producing excessive amounts of insulin. The hyperinsulinemia, in turn, causes acanthosis nigricans and excess gastric acid secretion, resulting in excess belly hunger, stomach upset, and epigastric pains, all grouped as "Dyspepsia".   Elizabeth Howell continues to gain weight. Elizabeth Howell is walking more, but not enough. Elizabeth Howell is also trying to eat more healthy, but not enough. 4. Hypertension: As above. Her SBP is normal today, but her DBP is mildly elevated.  Exercise will help. 5. Acanthosis: As above. Her acanthosis is worse, paralleling her gain in fat weight.  6. Dyspepsia: As above. Elizabeth Howell says that her belly hunger is less. Unfortunately, the ranitidine is not enough to offset her carb intake. I hope that Elizabeth Howell is taking her ranitidine reliably. I wonder how much ranitidine Elizabeth Howell actually takes. 7. Striae: The striae are essentially unchanged since her last visit. These striae do not appear to be related to Cushing's syndrome. 8. Hyperlipidemia: Her lipid levels in November 2018 were much better than they had been in the past. The simvastatin is definitely helping her. Elizabeth Howell needs to eat right ad to exercise more in order to assist the simvastatin.  9. Goiter: Her thyroid gland is slightly smaller, but still enlarged. The process of waxing and waning of thyroid gland size is c/w evolving Hashimoto's thyroiditis. Elizabeth Howell was euthyroid in March 2018, at about the lower 20% of the true normal thyroid hormone range.  10. Thyroiditis: Elizabeth Howell had had intermittent tenderness and swelling in the thyroid bed for several months prior to her initial visit and was tender bilaterally at that visit. Elizabeth Howell has also had tenderness in her thyroid bed at other times, but not  recently. Her Hashimoto's thyroiditis is clinically quiescent today. 11. Prediabetes:   A. Her HbA1c of 6.1% was definitely in the pre-diabetic range in September 2015. Her A1c at her last four visits were 5.5% 5.6%, 6.0%, and 5.9% respectively.   B. Today, Elizabeth Howell's HbA1c has decreased to 5.6%, the lowest that it has been in years.    C. Elizabeth Howell's C-peptide was mid-normal in March 2017 but was relatively low for her level of obesity. If Elizabeth Howell does not control her carb intake and does not make serious efforts to lose fat weight, her BGs will rise further into the diabetes range.  12. Elevated alkaline phosphatase (ALP): The most likely cause of her increased ALP would be puberty and vitamin D deficiency. Her A-P in November 2018 was normal.  13. Elevated ALT: This mild elevation is c/w mild NAFLD and is reversible with loss of fat weight.    PLAN:  1. Diagnostic: PTH, calcium, 25-OH vitamin D, TFTs 2. Therapeutic: Eat Right Diet, exercise for an hour per day. Continue ranitidine, 150 mg, twice daily. Continue metformin, 250 mg, twice daily.   3. Patient education: We discussed all of the above at great length. I again discussed with her how eating right and exercise could help her achieve her goal of weight loss. I reviewed our Eat Right Diet again. I asked her to exercise for one hour each day.   4. Follow-up: 3 months.   Level of Service: This visit lasted in excess of 55 minutes. More than 50% of the visit was devoted to counseling.  Tillman Sers, MD, CDE Adult and Pediatric Endocrinology

## 2017-12-09 NOTE — Patient Instructions (Signed)
Follow up visit in 4 months.  

## 2017-12-15 ENCOUNTER — Encounter (INDEPENDENT_AMBULATORY_CARE_PROVIDER_SITE_OTHER): Payer: Self-pay | Admitting: *Deleted

## 2017-12-27 ENCOUNTER — Other Ambulatory Visit (INDEPENDENT_AMBULATORY_CARE_PROVIDER_SITE_OTHER): Payer: Self-pay | Admitting: "Endocrinology

## 2017-12-27 DIAGNOSIS — R1013 Epigastric pain: Secondary | ICD-10-CM

## 2018-01-31 ENCOUNTER — Other Ambulatory Visit (INDEPENDENT_AMBULATORY_CARE_PROVIDER_SITE_OTHER): Payer: Self-pay | Admitting: Physician Assistant

## 2018-01-31 ENCOUNTER — Other Ambulatory Visit (INDEPENDENT_AMBULATORY_CARE_PROVIDER_SITE_OTHER): Payer: Self-pay | Admitting: "Endocrinology

## 2018-01-31 DIAGNOSIS — R7303 Prediabetes: Secondary | ICD-10-CM

## 2018-01-31 DIAGNOSIS — R1013 Epigastric pain: Secondary | ICD-10-CM

## 2018-02-02 ENCOUNTER — Other Ambulatory Visit (INDEPENDENT_AMBULATORY_CARE_PROVIDER_SITE_OTHER): Payer: Self-pay | Admitting: Physician Assistant

## 2018-02-02 DIAGNOSIS — R7303 Prediabetes: Secondary | ICD-10-CM

## 2018-02-02 NOTE — Telephone Encounter (Signed)
FWD to covering provider at [facility]. Tempestt S Roberts, CMA  

## 2018-02-04 ENCOUNTER — Other Ambulatory Visit (INDEPENDENT_AMBULATORY_CARE_PROVIDER_SITE_OTHER): Payer: Self-pay | Admitting: Physician Assistant

## 2018-02-04 DIAGNOSIS — R7303 Prediabetes: Secondary | ICD-10-CM

## 2018-03-15 ENCOUNTER — Other Ambulatory Visit (INDEPENDENT_AMBULATORY_CARE_PROVIDER_SITE_OTHER): Payer: Self-pay | Admitting: "Endocrinology

## 2018-03-15 DIAGNOSIS — R1013 Epigastric pain: Secondary | ICD-10-CM

## 2018-04-06 ENCOUNTER — Other Ambulatory Visit (INDEPENDENT_AMBULATORY_CARE_PROVIDER_SITE_OTHER): Payer: Self-pay | Admitting: Physician Assistant

## 2018-04-06 DIAGNOSIS — E785 Hyperlipidemia, unspecified: Secondary | ICD-10-CM

## 2018-04-07 NOTE — Telephone Encounter (Signed)
FWD to PCP. Sheena Simonis S Zoiee Wimmer, CMA  

## 2018-04-23 ENCOUNTER — Other Ambulatory Visit (INDEPENDENT_AMBULATORY_CARE_PROVIDER_SITE_OTHER): Payer: Self-pay | Admitting: "Endocrinology

## 2018-04-23 DIAGNOSIS — R1013 Epigastric pain: Secondary | ICD-10-CM

## 2018-06-06 ENCOUNTER — Other Ambulatory Visit (INDEPENDENT_AMBULATORY_CARE_PROVIDER_SITE_OTHER): Payer: Self-pay | Admitting: "Endocrinology

## 2018-06-06 DIAGNOSIS — R1013 Epigastric pain: Secondary | ICD-10-CM

## 2018-07-06 ENCOUNTER — Other Ambulatory Visit (INDEPENDENT_AMBULATORY_CARE_PROVIDER_SITE_OTHER): Payer: Self-pay | Admitting: "Endocrinology

## 2018-07-06 DIAGNOSIS — R1013 Epigastric pain: Secondary | ICD-10-CM

## 2018-07-20 ENCOUNTER — Other Ambulatory Visit: Payer: Self-pay

## 2018-07-20 ENCOUNTER — Ambulatory Visit (INDEPENDENT_AMBULATORY_CARE_PROVIDER_SITE_OTHER): Payer: Medicaid Other | Admitting: Physician Assistant

## 2018-07-20 ENCOUNTER — Encounter (INDEPENDENT_AMBULATORY_CARE_PROVIDER_SITE_OTHER): Payer: Self-pay | Admitting: Physician Assistant

## 2018-07-20 VITALS — BP 123/82 | HR 96 | Temp 98.7°F | Ht 62.0 in | Wt 257.8 lb

## 2018-07-20 DIAGNOSIS — R7303 Prediabetes: Secondary | ICD-10-CM | POA: Diagnosis not present

## 2018-07-20 DIAGNOSIS — R238 Other skin changes: Secondary | ICD-10-CM

## 2018-07-20 DIAGNOSIS — Z760 Encounter for issue of repeat prescription: Secondary | ICD-10-CM

## 2018-07-20 DIAGNOSIS — F32A Depression, unspecified: Secondary | ICD-10-CM

## 2018-07-20 DIAGNOSIS — R233 Spontaneous ecchymoses: Secondary | ICD-10-CM

## 2018-07-20 DIAGNOSIS — Z23 Encounter for immunization: Secondary | ICD-10-CM

## 2018-07-20 DIAGNOSIS — F329 Major depressive disorder, single episode, unspecified: Secondary | ICD-10-CM | POA: Diagnosis not present

## 2018-07-20 DIAGNOSIS — Z0000 Encounter for general adult medical examination without abnormal findings: Secondary | ICD-10-CM | POA: Diagnosis not present

## 2018-07-20 LAB — POCT GLYCOSYLATED HEMOGLOBIN (HGB A1C): Hemoglobin A1C: 5.6 % (ref 4.0–5.6)

## 2018-07-20 MED ORDER — VITAMIN D 1000 UNITS PO TABS: 1000.0000 [IU] | ORAL_TABLET | Freq: Every day | ORAL | 3 refills | Status: DC

## 2018-07-20 MED ORDER — RANITIDINE HCL 150 MG PO TABS: 150.0000 mg | ORAL_TABLET | Freq: Two times a day (BID) | ORAL | 2 refills | Status: DC

## 2018-07-20 MED ORDER — FISH OIL 1000 MG PO CAPS: 1.0000 | ORAL_CAPSULE | Freq: Every day | ORAL | 11 refills | Status: DC

## 2018-07-20 MED ORDER — METFORMIN HCL 500 MG PO TABS: 500.0000 mg | ORAL_TABLET | Freq: Two times a day (BID) | ORAL | 3 refills | Status: DC

## 2018-07-20 MED ORDER — SERTRALINE HCL 100 MG PO TABS: 100.0000 mg | ORAL_TABLET | Freq: Every day | ORAL | 1 refills | Status: DC

## 2018-07-20 NOTE — Patient Instructions (Signed)
Living With Depression Everyone experiences occasional disappointment, sadness, and loss in their lives. When you are feeling down, blue, or sad for at least 2 weeks in a row, it may mean that you have depression. Depression can affect your thoughts and feelings, relationships, daily activities, and physical health. It is caused by changes in the way your brain functions. If you receive a diagnosis of depression, your health care provider will tell you which type of depression you have and what treatment options are available to you. If you are living with depression, there are ways to help you recover from it and also ways to prevent it from coming back. How to cope with lifestyle changes Coping with stress Stress is your body's reaction to life changes and events, both good and bad. Stressful situations may include:  Getting married.  The death of a spouse.  Losing a job.  Retiring.  Having a baby.  Stress can last just a few hours or it can be ongoing. Stress can play a major role in depression, so it is important to learn both how to cope with stress and how to think about it differently. Talk with your health care provider or a counselor if you would like to learn more about stress reduction. He or she may suggest some stress reduction techniques, such as:  Music therapy. This can include creating music or listening to music. Choose music that you enjoy and that inspires you.  Mindfulness-based meditation. This kind of meditation can be done while sitting or walking. It involves being aware of your normal breaths, rather than trying to control your breathing.  Centering prayer. This is a kind of meditation that involves focusing on a spiritual word or phrase. Choose a word, phrase, or sacred image that is meaningful to you and that brings you peace.  Deep breathing. To do this, expand your stomach and inhale slowly through your nose. Hold your breath for 3-5 seconds, then exhale  slowly, allowing your stomach muscles to relax.  Muscle relaxation. This involves intentionally tensing muscles then relaxing them.  Choose a stress reduction technique that fits your lifestyle and personality. Stress reduction techniques take time and practice to develop. Set aside 5-15 minutes a day to do them. Therapists can offer training in these techniques. The training may be covered by some insurance plans. Other things you can do to manage stress include:  Keeping a stress diary. This can help you learn what triggers your stress and ways to control your response.  Understanding what your limits are and saying no to requests or events that lead to a schedule that is too full.  Thinking about how you respond to certain situations. You may not be able to control everything, but you can control how you react.  Adding humor to your life by watching funny films or TV shows.  Making time for activities that help you relax and not feeling guilty about spending your time this way.  Medicines Your health care provider may suggest certain medicines if he or she feels that they will help improve your condition. Avoid using alcohol and other substances that may prevent your medicines from working properly (may interact). It is also important to:  Talk with your pharmacist or health care provider about all the medicines that you take, their possible side effects, and what medicines are safe to take together.  Make it your goal to take part in all treatment decisions (shared decision-making). This includes giving input on the side   effects of medicines. It is best if shared decision-making with your health care provider is part of your total treatment plan.  If your health care provider prescribes a medicine, you may not notice the full benefits of it for 4-8 weeks. Most people who are treated for depression need to be on medicine for at least 6-12 months after they feel better. If you are taking  medicines as part of your treatment, do not stop taking medicines without first talking to your health care provider. You may need to have the medicine slowly decreased (tapered) over time to decrease the risk of harmful side effects. Relationships Your health care provider may suggest family therapy along with individual therapy and drug therapy. While there may not be family problems that are causing you to feel depressed, it is still important to make sure your family learns as much as they can about your mental health. Having your family's support can help make your treatment successful. How to recognize changes in your condition Everyone has a different response to treatment for depression. Recovery from major depression happens when you have not had signs of major depression for two months. This may mean that you will start to:  Have more interest in doing activities.  Feel less hopeless than you did 2 months ago.  Have more energy.  Overeat less often, or have better or improving appetite.  Have better concentration.  Your health care provider will work with you to decide the next steps in your recovery. It is also important to recognize when your condition is getting worse. Watch for these signs:  Having fatigue or low energy.  Eating too much or too little.  Sleeping too much or too little.  Feeling restless, agitated, or hopeless.  Having trouble concentrating or making decisions.  Having unexplained physical complaints.  Feeling irritable, angry, or aggressive.  Get help as soon as you or your family members notice these symptoms coming back. How to get support and help from others How to talk with friends and family members about your condition Talking to friends and family members about your condition can provide you with one way to get support and guidance. Reach out to trusted friends or family members, explain your symptoms to them, and let them know that you are  working with a health care provider to treat your depression. Financial resources Not all insurance plans cover mental health care, so it is important to check with your insurance carrier. If paying for co-pays or counseling services is a problem, search for a local or county mental health care center. They may be able to offer public mental health care services at low or no cost when you are not able to see a private health care provider. If you are taking medicine for depression, you may be able to get the generic form, which may be less expensive. Some makers of prescription medicines also offer help to patients who cannot afford the medicines they need. Follow these instructions at home:  Get the right amount and quality of sleep.  Cut down on using caffeine, tobacco, alcohol, and other potentially harmful substances.  Try to exercise, such as walking or lifting small weights.  Take over-the-counter and prescription medicines only as told by your health care provider.  Eat a healthy diet that includes plenty of vegetables, fruits, whole grains, low-fat dairy products, and lean protein. Do not eat a lot of foods that are high in solid fats, added sugars, or salt.    Keep all follow-up visits as told by your health care provider. This is important. Contact a health care provider if:  You stop taking your antidepressant medicines, and you have any of these symptoms: ? Nausea. ? Headache. ? Feeling lightheaded. ? Chills and body aches. ? Not being able to sleep (insomnia).  You or your friends and family think your depression is getting worse. Get help right away if:  You have thoughts of hurting yourself or others. If you ever feel like you may hurt yourself or others, or have thoughts about taking your own life, get help right away. You can go to your nearest emergency department or call:  Your local emergency services (911 in the [place]  A suicide crisis helpline, such as the  National Suicide Prevention Lifeline at 1-800-273-8255. This is open 24-hours a day.  Summary  If you are living with depression, there are ways to help you recover from it and also ways to prevent it from coming back.  Work with your health care team to create a management plan that includes counseling, stress management techniques, and healthy lifestyle habits. This information is not intended to replace advice given to you by your health care provider. Make sure you discuss any questions you have with your health care provider. Document Released: 08/05/2016 Document Revised: 08/05/2016 Document Reviewed: 08/05/2016 Elsevier Interactive Patient Education  2018 [employer]  

## 2018-07-20 NOTE — Progress Notes (Signed)
Subjective:  Patient ID: Elizabeth Howell, female    DOB: [birth year]  Age: 22 y.o. MRN: [id number]  CC: annual exam  HPI Elizabeth Howell is a 22 y.o. female with a medical history of Depression, anxiety, prediabetes, and ADD presents for an annual physical exam. Does not endorse any symptoms or complaints whatsoever. However, PHQ9 12 and GAD7 0 in clinic today. Taking Sertraline as directed. Most of her mood disorder was attributed to self esteem issues that were aggravated while she was in [employer]. She has since graduated and feels much better. Has also begun to exercise and drink water/watch her diet.      Says she wants to get a tattoo but easily bruises. Would like to know if she has a coagulopathy.       Outpatient Medications Prior to Visit  Medication Sig Dispense Refill  . cholecalciferol (VITAMIN D) 1000 units tablet Take 1,000 Units daily by mouth.    . metFORMIN (GLUCOPHAGE) 500 MG tablet Take daily after supper by mouth.    . Omega-3 Fatty Acids (FISH OIL PO) Take by mouth.    . ranitidine (ZANTAC) 150 MG tablet TAKE 1 TABLET BY MOUTH TWICE A DAY 60 tablet 0  . simvastatin (ZOCOR) 20 MG tablet TAKE 1 TABLET BY MOUTH EVERYDAY AT BEDTIME 30 tablet 5  . sertraline (ZOLOFT) 100 MG tablet Take 100 mg daily by mouth.    Marland Kitchen HYDROcodone-acetaminophen (NORCO/VICODIN) 5-325 MG tablet Take 1-2 tablets every 6 (six) hours as needed by mouth for moderate pain. (Patient not taking: Reported on 12/09/2017) 10 tablet 0  . ibuprofen (ADVIL,MOTRIN) 800 MG tablet Take 1 tablet (800 mg total) every 8 (eight) hours as needed by mouth. (Patient not taking: Reported on 12/09/2017) 30 tablet 0   No facility-administered medications prior to visit.      ROS Review of Systems  Constitutional: Negative for chills, fever and malaise/fatigue.  Eyes: Negative for blurred vision.  Respiratory: Negative for shortness of breath.   Cardiovascular: Negative for chest pain and palpitations.   Gastrointestinal: Negative for abdominal pain and nausea.  Genitourinary: Negative for dysuria and hematuria.  Musculoskeletal: Negative for joint pain and myalgias.  Skin: Negative for rash.  Neurological: Negative for tingling and headaches.  Psychiatric/Behavioral: Negative for depression. The patient is not nervous/anxious.     Objective:  BP 123/82 (BP Location: Right Arm, Patient Position: Sitting, Cuff Size: Large)   Pulse 96   Temp 98.7 F (37.1 C) (Oral)   Ht 5\' 2"  (1.575 m)   Wt 257 lb 12.8 oz (116.9 kg)   LMP 07/14/2018 (Approximate)   SpO2 94%   BMI 47.15 kg/m   BP/Weight 07/20/2018 12/09/2017 09/32/3557  Systolic BP 322 025 427  Diastolic BP 82 78 66  Wt. (Lbs) 257.8 256.6 261.6  BMI 47.15 45.47 47.85      Physical Exam  Constitutional: She is oriented to person, place, and time.  Well developed, obese, NAD, polite  HENT:  Head: Normocephalic and atraumatic.  Mouth/Throat: No oropharyngeal exudate.  Eyes: Pupils are equal, round, and reactive to light. Conjunctivae and EOM are normal. No scleral icterus.  Neck: Normal range of motion. Neck supple. No thyromegaly present.  Cardiovascular: Normal rate, regular rhythm and normal heart sounds.  Pulmonary/Chest: Effort normal and breath sounds normal.  Abdominal: Soft. Bowel sounds are normal. There is no tenderness.  Musculoskeletal: Normal range of motion. She exhibits no edema.  Neurological: She is alert and oriented to person, place, and time. No  cranial nerve deficit. She exhibits normal muscle tone. Coordination normal.  Skin: Skin is warm and dry. No rash noted. No erythema. No pallor.  Psychiatric: She has a normal mood and affect. Her behavior is normal. Thought content normal.  Vitals reviewed.    Assessment & Plan:   1. Annual physical exam - CBC with Differential - Comprehensive metabolic panel - TSH - Lipid panel  2. Prediabetes - HgB A1c 5.6%  3. Need for prophylactic vaccination and  inoculation against influenza - Flu Vaccine QUAD 6+ mos PF IM (Fluarix Quad PF)  4. Depression, unspecified depression type - Continue Sertraline 100 mg daily, diet, and exercise.   5. Easy bruising - PT AND PTT  6. Medication refill - metFORMIN (GLUCOPHAGE) 500 MG tablet; Take 1 tablet (500 mg total) by mouth 2 (two) times daily with a meal.  Dispense: 180 tablet; Refill: 3 - sertraline (ZOLOFT) 100 MG tablet; Take 1 tablet (100 mg total) by mouth daily.  Dispense: 90 tablet; Refill: 1 - ranitidine (ZANTAC) 150 MG tablet; Take 1 tablet (150 mg total) by mouth 2 (two) times daily.  Dispense: 60 tablet; Refill: 2 - Omega-3 Fatty Acids (FISH OIL) 1000 MG CAPS; Take 1 capsule (1,000 mg total) by mouth daily.  Dispense: 30 capsule; Refill: 11 - cholecalciferol (VITAMIN D) 1000 units tablet; Take 1 tablet (1,000 Units total) by mouth daily.  Dispense: 90 tablet; Refill: 3   Meds ordered this encounter  Medications  . metFORMIN (GLUCOPHAGE) 500 MG tablet    Sig: Take 1 tablet (500 mg total) by mouth 2 (two) times daily with a meal.    Dispense:  180 tablet    Refill:  3    Order Specific Question:   Supervising Provider    Answer:   Charlott Rakes [[id number]]  . sertraline (ZOLOFT) 100 MG tablet    Sig: Take 1 tablet (100 mg total) by mouth daily.    Dispense:  90 tablet    Refill:  1    Order Specific Question:   Supervising Provider    Answer:   Charlott Rakes [[id number]]  . ranitidine (ZANTAC) 150 MG tablet    Sig: Take 1 tablet (150 mg total) by mouth 2 (two) times daily.    Dispense:  60 tablet    Refill:  2    Order Specific Question:   Supervising Provider    Answer:   Charlott Rakes [[id number]]  . Omega-3 Fatty Acids (FISH OIL) 1000 MG CAPS    Sig: Take 1 capsule (1,000 mg total) by mouth daily.    Dispense:  30 capsule    Refill:  11    Order Specific Question:   Supervising Provider    Answer:   Charlott Rakes [[id number]]  . cholecalciferol (VITAMIN D) 1000 units tablet    Sig: Take 1  tablet (1,000 Units total) by mouth daily.    Dispense:  90 tablet    Refill:  3    Order Specific Question:   Supervising Provider    Answer:   Charlott Rakes [[id number]]    Follow-up: Return in about 6 weeks (around 08/31/2018) for depression.   Clent Demark PA

## 2018-07-21 ENCOUNTER — Other Ambulatory Visit (INDEPENDENT_AMBULATORY_CARE_PROVIDER_SITE_OTHER): Payer: Self-pay | Admitting: Physician Assistant

## 2018-07-21 DIAGNOSIS — E7841 Elevated Lipoprotein(a): Secondary | ICD-10-CM

## 2018-07-21 LAB — COMPREHENSIVE METABOLIC PANEL
ALT: 22 IU/L (ref 0–32)
AST: 20 IU/L (ref 0–40)
Albumin/Globulin Ratio: 1.4 (ref 1.2–2.2)
Albumin: 4.2 g/dL (ref 3.5–5.5)
Alkaline Phosphatase: 116 IU/L (ref 39–117)
BUN/Creatinine Ratio: 18 (ref 9–23)
BUN: 12 mg/dL (ref 6–20)
Bilirubin Total: <0.2 mg/dL (ref 0.0–1.2)
CALCIUM: 9.7 mg/dL (ref 8.7–10.2)
CO2: 22 mmol/L (ref 20–29)
CREATININE: 0.66 mg/dL (ref 0.57–1.00)
Chloride: 103 mmol/L (ref 96–106)
GFR calc non Af Amer: 128 mL/min/{1.73_m2} (ref >59)
GFR, EST AFRICAN AMERICAN: 147 mL/min/{1.73_m2} (ref >59)
Globulin, Total: 2.9 g/dL (ref 1.5–4.5)
Glucose: 104 mg/dL — ABNORMAL HIGH (ref 65–99)
Potassium: 4.1 mmol/L (ref 3.5–5.2)
Sodium: 141 mmol/L (ref 134–144)
Total Protein: 7.1 g/dL (ref 6.0–8.5)

## 2018-07-21 LAB — PT AND PTT
INR: 0.9 (ref 0.8–1.2)
Prothrombin Time: 9.9 s (ref 9.1–12.0)
aPTT: 26 s (ref 24–33)

## 2018-07-21 LAB — CBC WITH DIFFERENTIAL/PLATELET
BASOS: 0 %
Basophils Absolute: 0 10*3/uL (ref 0.0–0.2)
EOS (ABSOLUTE): 0.3 10*3/uL (ref 0.0–0.4)
EOS: 3 %
HEMATOCRIT: 38.7 % (ref 34.0–46.6)
Hemoglobin: 12.9 g/dL (ref 11.1–15.9)
IMMATURE GRANULOCYTES: 0 %
Immature Grans (Abs): 0 10*3/uL (ref 0.0–0.1)
LYMPHS ABS: 3.2 10*3/uL — AB (ref 0.7–3.1)
Lymphs: 30 %
MCH: 28.2 pg (ref 26.6–33.0)
MCHC: 33.3 g/dL (ref 31.5–35.7)
MCV: 85 fL (ref 79–97)
Monocytes Absolute: 0.5 10*3/uL (ref 0.1–0.9)
Monocytes: 5 %
NEUTROS PCT: 62 %
Neutrophils Absolute: 6.5 10*3/uL (ref 1.4–7.0)
PLATELETS: 381 10*3/uL (ref 150–450)
RBC: 4.57 x10E6/uL (ref 3.77–5.28)
RDW: 14.5 % (ref 12.3–15.4)
WBC: 10.5 10*3/uL (ref 3.4–10.8)

## 2018-07-21 LAB — TSH: TSH: 3.65 u[IU]/mL (ref 0.450–4.500)

## 2018-07-21 LAB — LIPID PANEL
CHOL/HDL RATIO: 6.1 ratio — AB (ref 0.0–4.4)
Cholesterol, Total: 242 mg/dL — ABNORMAL HIGH (ref 100–199)
HDL: 40 mg/dL (ref >39)
LDL Calculated: 163 mg/dL — ABNORMAL HIGH (ref 0–99)
Triglycerides: 195 mg/dL — ABNORMAL HIGH (ref 0–149)
VLDL CHOLESTEROL CAL: 39 mg/dL (ref 5–40)

## 2018-07-21 MED ORDER — ATORVASTATIN CALCIUM 40 MG PO TABS: 40.0000 mg | ORAL_TABLET | Freq: Every day | ORAL | 3 refills | Status: DC

## 2018-07-22 ENCOUNTER — Telehealth (INDEPENDENT_AMBULATORY_CARE_PROVIDER_SITE_OTHER): Payer: Self-pay

## 2018-07-22 NOTE — Telephone Encounter (Signed)
Patients grandfather is aware that labs are normal except for elevated cholesterol. Simvastatin 20 mg will not e enough to lower. Atorvastatin 40 mg has been sent to her pharmacy. Be careful to not become pregnant while taking atorvastatin. Patients grandfather stated he would go pick up medication today. He will also inform patient of results. Nat Christen, CMA

## 2018-07-22 NOTE — Telephone Encounter (Signed)
-----   Message from Clent Demark, PA-C sent at 07/21/2018  1:41 PM EST ----- Labs normal except for elevated cholesterol. Simvastatin 20 mg will not be enough to lower cholesterol. I have sent atorvastatin 40 mg for her to take. Be careful not to become pregnant while taking atorvastatin.

## 2018-09-02 ENCOUNTER — Other Ambulatory Visit (INDEPENDENT_AMBULATORY_CARE_PROVIDER_SITE_OTHER): Payer: Self-pay | Admitting: Physician Assistant

## 2018-09-02 DIAGNOSIS — E785 Hyperlipidemia, unspecified: Secondary | ICD-10-CM

## 2018-09-23 ENCOUNTER — Other Ambulatory Visit (INDEPENDENT_AMBULATORY_CARE_PROVIDER_SITE_OTHER): Payer: Self-pay | Admitting: Physician Assistant

## 2018-09-23 DIAGNOSIS — Z760 Encounter for issue of repeat prescription: Secondary | ICD-10-CM

## 2018-09-23 NOTE — Telephone Encounter (Signed)
FWD to PCP. Kaitrin Seybold S Mashell Sieben, CMA  

## 2018-10-12 ENCOUNTER — Ambulatory Visit (INDEPENDENT_AMBULATORY_CARE_PROVIDER_SITE_OTHER): Payer: Medicaid Other | Admitting: Internal Medicine

## 2018-10-12 ENCOUNTER — Ambulatory Visit
Admission: EM | Admit: 2018-10-12 | Discharge: 2018-10-12 | Disposition: A | Discharge status: Home / Self Care | Payer: Medicaid Other | Attending: Family Medicine | Admitting: Family Medicine

## 2018-10-12 ENCOUNTER — Encounter: Payer: Self-pay | Admitting: Emergency Medicine

## 2018-10-12 DIAGNOSIS — L03114 Cellulitis of left upper limb: Secondary | ICD-10-CM

## 2018-10-12 DIAGNOSIS — L732 Hidradenitis suppurativa: Secondary | ICD-10-CM | POA: Insufficient documentation

## 2018-10-12 MED ORDER — SULFAMETHOXAZOLE-TRIMETHOPRIM 800-160 MG PO TABS: 1.0000 | ORAL_TABLET | Freq: Two times a day (BID) | ORAL | 0 refills | Status: AC

## 2018-10-12 MED ORDER — TRAMADOL HCL 50 MG PO TABS: 50.0000 mg | ORAL_TABLET | Freq: Four times a day (QID) | ORAL | 0 refills | Status: DC | PRN

## 2018-10-12 NOTE — ED Triage Notes (Signed)
Pt states she had surgery to remove a cyst under her L armpit around a year ago, states shes had recurrent cysts that come and go in the same place. Pt states she just noticed a lump under her armpit for a few days, with pain for a couple of weeks.

## 2018-10-12 NOTE — Discharge Instructions (Addendum)
Warm compresses to area Take the antibiotic as instructed 2 x a day Take the pain medicine as needed Caution drowsiness Follow up with your doctor

## 2018-10-12 NOTE — ED Provider Notes (Signed)
[name]    CSN: [id number] Arrival date & time: 10/12/18  1529     History   Chief Complaint Chief Complaint  Patient presents with  . Cyst    HPI Elizabeth Howell is a 23 y.o. female.   HPI lump under armpit Has had surgery for hydradrenitis axilla on the L She states this is been present for 2 days.  It is getting bigger.  It is painful. She wonders whether this needs to be lanced. No fever or chills.  Past Medical History:  Diagnosis Date  . ADHD (attention deficit hyperactivity disorder)    no current med.  . Depression   . Hidradenitis suppurativa of left axilla 07/2017  . History of asthma    no current med.  . Hyperlipidemia   . Nocturia   . Pre-diabetes     Patient Active Problem List   Diagnosis Date Noted  . Anxiety and depression 01/14/2017  . Dyslipidemia 11/19/2016  . Morbid obesity ([place]) 11/25/2014  . Insulin resistance 11/25/2014  . Hyperinsulinemia 11/25/2014  . Essential hypertension, benign 11/25/2014  . Acanthosis nigricans, acquired 11/25/2014  . Dyspepsia 11/25/2014  . Goiter 11/25/2014  . Thyroiditis, autoimmune 11/25/2014  . 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 Skin striae 11/25/2014  . Prediabetes 11/25/2014    Past Surgical History:  Procedure Laterality Date  . HYDRADENITIS EXCISION Left 07/30/2017   Procedure: EXCISION LEFT AXILLA HIDRADENITIS;  Surgeon: Erroll Luna, MD;  Location: [facility];  Service: General;  Laterality: Left;  . WISDOM TOOTH EXTRACTION      OB History   No obstetric history on file.      Home Medications    Prior to Admission medications   Medication Sig Start Date End Date Taking? Authorizing Provider  atorvastatin (LIPITOR) 40 MG tablet Take 1 tablet (40 mg total) by mouth daily. 07/21/18   Clent Demark, PA-C  cholecalciferol (VITAMIN D) 1000 units tablet Take 1 tablet (1,000 Units total) by mouth daily. 07/20/18   Clent Demark, PA-C  metFORMIN  (GLUCOPHAGE) 500 MG tablet Take 1 tablet (500 mg total) by mouth 2 (two) times daily with a meal. 07/20/18   Clent Demark, PA-C  Omega-3 Fatty Acids (FISH OIL) 1000 MG CAPS Take 1 capsule (1,000 mg total) by mouth daily. 07/20/18   Clent Demark, PA-C  ranitidine (ZANTAC) 150 MG tablet TAKE 1 TABLET BY MOUTH TWICE A DAY 09/24/18   Clent Demark, PA-C  sertraline (ZOLOFT) 100 MG tablet Take 1 tablet (100 mg total) by mouth daily. 07/20/18   Clent Demark, PA-C  sulfamethoxazole-trimethoprim (BACTRIM DS,SEPTRA DS) 800-160 MG tablet Take 1 tablet by mouth 2 (two) times daily for 7 days. 10/12/18 10/19/18  Raylene Everts, MD  traMADol (ULTRAM) 50 MG tablet Take 1-2 tablets (50-100 mg total) by mouth every 6 (six) hours as needed. Take with food 10/12/18   Raylene Everts, MD    Family History Family History  Problem Relation Age of Onset  . Hypertension Maternal Grandfather   . Diabetes Other     Social History Social History   Tobacco Use  . Smoking status: Never Smoker  . Smokeless tobacco: Never Used  Substance Use Topics  . Alcohol use: No    Alcohol/week: 0.0 standard drinks    Frequency: Never  . Drug use: No     Allergies   Patient has no known allergies.   Review of Systems Review of Systems  Constitutional: Negative for  chills and fever.  HENT: Negative for ear pain and sore throat.   Eyes: Negative for pain and visual disturbance.  Respiratory: Negative for cough and shortness of breath.   Cardiovascular: Negative for chest pain and palpitations.  Gastrointestinal: Negative for abdominal pain and vomiting.  Genitourinary: Negative for dysuria and hematuria.  Musculoskeletal: Negative for arthralgias and back pain.  Skin: Positive for wound. Negative for color change and rash.  Neurological: Negative for seizures and syncope.  All other systems reviewed and are negative.    Physical Exam Triage Vital Signs ED Triage Vitals  Enc Vitals Group       BP 10/12/18 1535 109/62     Pulse Rate 10/12/18 1535 (!) 107     Resp 10/12/18 1535 14     Temp 10/12/18 1535 98.4 F (36.9 C)     Temp src --      SpO2 10/12/18 1535 97 %     Weight --      Height --      Head Circumference --      Peak Flow --      Pain Score 10/12/18 1537 8     Pain Loc --      Pain Edu? --      Excl. in [place]? --    No data found.  Updated Vital Signs BP 109/62   Pulse (!) 107   Temp 98.4 F (36.9 C)   Resp 14   LMP 09/15/2018   SpO2 97%   Visual Acuity Right Eye Distance:   Left Eye Distance:   Bilateral Distance:    Right Eye Near:   Left Eye Near:    Bilateral Near:     Physical Exam Constitutional:      General: She is not in acute distress.    Appearance: She is well-developed.  HENT:     Head: Normocephalic and atraumatic.  Eyes:     Conjunctiva/sclera: Conjunctivae normal.     Pupils: Pupils are equal, round, and reactive to light.  Neck:     Musculoskeletal: Normal range of motion.  Cardiovascular:     Rate and Rhythm: Normal rate and regular rhythm.     Heart sounds: Normal heart sounds.  Pulmonary:     Effort: Pulmonary effort is normal. No respiratory distress.     Breath sounds: Normal breath sounds.  Abdominal:     General: There is no distension.     Palpations: Abdomen is soft.  Musculoskeletal: Normal range of motion.  Skin:    General: Skin is warm and dry.     Comments: Left axilla has a well-healed scar.  Inferior to the scar there is a soft warm mass that is not fluctuant, measures 4 x 6 cm.  Neurological:     General: No focal deficit present.     Mental Status: She is alert. Mental status is at baseline.  Psychiatric:        Mood and Affect: Mood normal.        Thought Content: Thought content normal.      UC Treatments / Results  Labs (all labs ordered are listed, but only abnormal results are displayed) Labs Reviewed - No data to display  EKG None  Radiology No results  found.  Procedures Procedures (including critical care time)  Medications Ordered in UC Medications - No data to display  Initial Impression / Assessment and Plan / UC Course  I have reviewed the triage vital signs and the nursing  notes.  Pertinent labs & imaging results that were available during my care of the patient were reviewed by me and considered in my medical decision making (see chart for details).     I do not feel any fluctuance or mass that feels like induration.  This may be an enlarged node, may be some cellulitis.  It is warm and tender so I am going to give her a course of antibiotics.  She should follow-up with her PCP or return here if she fails to improve over the next couple of days Final Clinical Impressions(s) / UC Diagnoses   Final diagnoses:  Hydradenitis  Cellulitis of left upper extremity     Discharge Instructions     Warm compresses to area Take the antibiotic as instructed 2 x a day Take the pain medicine as needed Caution drowsiness Follow up with your doctor    ED Prescriptions    Medication Sig Dispense Auth. Provider   sulfamethoxazole-trimethoprim (BACTRIM DS,SEPTRA DS) 800-160 MG tablet Take 1 tablet by mouth 2 (two) times daily for 7 days. 14 tablet Raylene Everts, MD   traMADol (ULTRAM) 50 MG tablet Take 1-2 tablets (50-100 mg total) by mouth every 6 (six) hours as needed. Take with food 15 tablet Raylene Everts, MD     Controlled Substance Prescriptions [place] Controlled Substance Registry consulted? Not Applicable   Raylene Everts, MD 10/12/18 [phone]

## 2018-10-13 DIAGNOSIS — F902 Attention-deficit hyperactivity disorder, combined type: Secondary | ICD-10-CM | POA: Diagnosis not present

## 2018-10-13 DIAGNOSIS — F3341 Major depressive disorder, recurrent, in partial remission: Secondary | ICD-10-CM | POA: Diagnosis not present

## 2018-10-14 ENCOUNTER — Encounter (HOSPITAL_COMMUNITY): Payer: Self-pay

## 2018-10-14 ENCOUNTER — Ambulatory Visit (HOSPITAL_COMMUNITY)
Admission: EM | Admit: 2018-10-14 | Discharge: 2018-10-14 | Disposition: A | Discharge status: Home / Self Care | Payer: Medicaid Other | Attending: Family Medicine | Admitting: Family Medicine

## 2018-10-14 DIAGNOSIS — L02412 Cutaneous abscess of left axilla: Secondary | ICD-10-CM | POA: Diagnosis not present

## 2018-10-14 NOTE — ED Notes (Signed)
Bed: UC04 Expected date:  Expected time:  Means of arrival:  Comments: appt

## 2018-10-14 NOTE — ED Provider Notes (Signed)
[facility]   [id number] 10/14/18 Arrival Time: 1050  ASSESSMENT & PLAN:  1. Abscess of left axilla    Incision and Drainage Procedure Note  Anesthesia: 1% plain lidocaine  Procedure Details  The procedure, risks and complications have been discussed in detail (including, but not limited to pain and bleeding) with the patient.  The skin induration was prepped and draped in the usual fashion. After adequate local anesthesia, I&D with a #11 blade was performed on the left axilla with copious purulent drainage.  EBL: minimal Drains: none Packing: none Condition: Tolerated procedure well Complications: none.  To continue and finish Bactrim.  Follow-up Information    [place].   Specialty:  Urgent Care Why:  For wound check in 2-3 days if not showing significant improvement. Contact information: [place] [address] [phone]         Sitz soaks BID. OTC analgesics as needed.  Reviewed expectations re: course of current medical issues. Questions answered. Outlined signs and symptoms indicating need for more acute intervention. Patient verbalized understanding. After Visit Summary given.   SUBJECTIVE:  Elizabeth Howell is a 23 y.o. female who was seen 10/12/2018 at [facility]. Note reviewed. Has been taking Bactrim. Presents today with increased swelling of area in L axilla.without active drainage and without active bleeding. Symptoms have gradually worsened since beginning. Fever: absent. OTC/home treatment: none.  ROS: As per HPI.  OBJECTIVE:  Vitals:   10/14/18 1141  Pulse: (!) 107  Resp: 20  Temp: 98.3 F (36.8 C)  TempSrc: Oral  SpO2: 96%    General appearance: alert; no distress Skin: 2 cm induration of her L axilla; tender to touch; no active drainage or bleeding LUE: FROM without difficulty Psychological: alert and cooperative; normal mood and affect  No Known  Allergies  Past Medical History:  Diagnosis Date  . ADHD (attention deficit hyperactivity disorder)    no current med.  . Depression   . Hidradenitis suppurativa of left axilla 07/2017  . History of asthma    no current med.  . Hyperlipidemia   . Nocturia   . Pre-diabetes    Social History   Socioeconomic History  . Marital status: Single    Spouse name: Not on file  . Number of children: Not on file  . Years of education: Not on file  . Highest education level: Not on file  Occupational History  . Not on file  Social Needs  . Financial resource strain: Not on file  . Food insecurity:    Worry: Not on file    Inability: Not on file  . Transportation needs:    Medical: Not on file    Non-medical: Not on file  Tobacco Use  . Smoking status: Never Smoker  . Smokeless tobacco: Never Used  Substance and Sexual Activity  . Alcohol use: No    Alcohol/week: 0.0 standard drinks    Frequency: Never  . Drug use: No  . Sexual activity: Not on file  Lifestyle  . Physical activity:    Days per week: Not on file    Minutes per session: Not on file  . Stress: Not on file  Relationships  . Social connections:    Talks on phone: Not on file    Gets together: Not on file    Attends religious service: Not on file    Active member of club or organization: Not on file    Attends  meetings of clubs or organizations: Not on file    Relationship status: Not on file  Other Topics Concern  . Not on file  Social History Narrative   Is in 9th grade at [employer] History  Problem Relation Age of Onset  . Hypertension Maternal Grandfather   . Diabetes Other    Past Surgical History:  Procedure Laterality Date  . HYDRADENITIS EXCISION Left 07/30/2017   Procedure: EXCISION LEFT AXILLA HIDRADENITIS;  Surgeon: Erroll Luna, MD;  Location: [facility];  Service: General;  Laterality: Left;  . WISDOM TOOTH EXTRACTION             Vanessa Kick,  MD 10/22/18 [phone]

## 2018-10-14 NOTE — ED Triage Notes (Signed)
Pt presents with abscess under left arm.

## 2018-10-14 NOTE — Discharge Instructions (Addendum)
Continue and finish all of your antibiotic.

## 2018-10-30 ENCOUNTER — Other Ambulatory Visit (INDEPENDENT_AMBULATORY_CARE_PROVIDER_SITE_OTHER): Payer: Self-pay

## 2018-10-30 DIAGNOSIS — Z760 Encounter for issue of repeat prescription: Secondary | ICD-10-CM

## 2018-10-30 DIAGNOSIS — K219 Gastro-esophageal reflux disease without esophagitis: Secondary | ICD-10-CM

## 2018-10-30 MED ORDER — RANITIDINE HCL 150 MG PO TABS: 150.0000 mg | ORAL_TABLET | Freq: Two times a day (BID) | ORAL | 2 refills | Status: DC

## 2018-11-02 ENCOUNTER — Ambulatory Visit (HOSPITAL_COMMUNITY)
Admission: EM | Admit: 2018-11-02 | Discharge: 2018-11-02 | Disposition: A | Discharge status: Home / Self Care | Payer: Medicaid Other | Attending: Internal Medicine | Admitting: Internal Medicine

## 2018-11-02 ENCOUNTER — Encounter (HOSPITAL_COMMUNITY): Payer: Self-pay

## 2018-11-02 DIAGNOSIS — L732 Hidradenitis suppurativa: Secondary | ICD-10-CM | POA: Insufficient documentation

## 2018-11-02 DIAGNOSIS — M79622 Pain in left upper arm: Secondary | ICD-10-CM | POA: Insufficient documentation

## 2018-11-02 MED ORDER — CLINDAMYCIN HCL 300 MG PO CAPS: 300.0000 mg | ORAL_CAPSULE | Freq: Three times a day (TID) | ORAL | 0 refills | Status: DC

## 2018-11-02 NOTE — ED Triage Notes (Signed)
Pt presents with recurrent abscess on left underarm area.

## 2018-11-02 NOTE — ED Provider Notes (Signed)
MRN: [id number] DOB: [birth year]  Subjective:   Elizabeth Howell is a 23 y.o. female presenting for ongoing drainage from her left axilla.  Patient had I&D at the end of January 2020, completed a course of Bactrim.  Reports that her symptoms are significantly improved but not resolved.  She tries to keep her wound covered but admits that she does not always do this.  She is also had previous surgical intervention with [facility].  She has not contacted them for recheck.  No current facility-administered medications for this encounter.   Current Outpatient Medications:  .  atorvastatin (LIPITOR) 40 MG tablet, Take 1 tablet (40 mg total) by mouth daily., Disp: 90 tablet, Rfl: 3 .  cholecalciferol (VITAMIN D) 1000 units tablet, Take 1 tablet (1,000 Units total) by mouth daily., Disp: 90 tablet, Rfl: 3 .  metFORMIN (GLUCOPHAGE) 500 MG tablet, Take 1 tablet (500 mg total) by mouth 2 (two) times daily with a meal., Disp: 180 tablet, Rfl: 3 .  Omega-3 Fatty Acids (FISH OIL) 1000 MG CAPS, Take 1 capsule (1,000 mg total) by mouth daily., Disp: 30 capsule, Rfl: 11 .  ranitidine (ZANTAC) 150 MG tablet, Take 1 tablet (150 mg total) by mouth 2 (two) times daily., Disp: 60 tablet, Rfl: 2 .  sertraline (ZOLOFT) 100 MG tablet, Take 1 tablet (100 mg total) by mouth daily., Disp: 90 tablet, Rfl: 1 .  traMADol (ULTRAM) 50 MG tablet, Take 1-2 tablets (50-100 mg total) by mouth every 6 (six) hours as needed. Take with food, Disp: 15 tablet, Rfl: 0   No Known Allergies  Past Medical History:  Diagnosis Date  . ADHD (attention deficit hyperactivity disorder)    no current med.  . Depression   . Hidradenitis suppurativa of left axilla 07/2017  . History of asthma    no current med.  . Hyperlipidemia   . Nocturia   . Pre-diabetes      Past Surgical History:  Procedure Laterality Date  . HYDRADENITIS EXCISION Left 07/30/2017   Procedure: EXCISION LEFT AXILLA HIDRADENITIS;  Surgeon: Erroll Luna, MD;  Location: [place];  Service: General;  Laterality: Left;  . WISDOM TOOTH EXTRACTION      ROS  Objective:   Vitals: BP 132/89 (BP Location: Left Arm)   Pulse 78   Temp 98.2 F (36.8 C) (Oral)   Resp 18   LMP 10/13/2018   SpO2 99%   Physical Exam Constitutional:      General: She is not in acute distress.    Appearance: Normal appearance. She is well-developed. She is not ill-appearing.  HENT:     Head: Normocephalic and atraumatic.     Nose: Nose normal.     Mouth/Throat:     Mouth: Mucous membranes are moist.     Pharynx: Oropharynx is clear.  Eyes:     General: No scleral icterus.    Extraocular Movements: Extraocular movements intact.     Pupils: Pupils are equal, round, and reactive to light.  Cardiovascular:     Rate and Rhythm: Normal rate.  Pulmonary:     Effort: Pulmonary effort is normal.  Skin:    General: Skin is warm and dry.       Neurological:     General: No focal deficit present.     Mental Status: She is alert and oriented to person, place, and time.  Psychiatric:        Mood and Affect: Mood normal.  Behavior: Behavior normal.    Assessment and Plan :   Hidradenitis  Wound culture pending, will have patient take a short course of clindamycin to cover for wound infection.  I recommended she contact [facility] for recheck. Counseled patient on potential for adverse effects with medications prescribed today, patient verbalized understanding. ER and return-to-clinic precautions discussed, patient verbalized understanding.    Jaynee Eagles, [place] 11/02/18 [phone]

## 2018-11-02 NOTE — Discharge Instructions (Addendum)
Use warm compresses. Keep the wound covered until it stops draining and closes off. Contact the surgeon that helped you before.

## 2018-11-04 LAB — AEROBIC CULTURE W GRAM STAIN (SUPERFICIAL SPECIMEN)

## 2018-11-04 LAB — AEROBIC CULTURE  (SUPERFICIAL SPECIMEN): CULTURE: NORMAL

## 2018-11-06 ENCOUNTER — Telehealth (HOSPITAL_COMMUNITY): Payer: Self-pay | Admitting: Emergency Medicine

## 2018-11-06 NOTE — Telephone Encounter (Signed)
Normal skin flora on culture, treated with clindamycin, attempted to contact patient to see how she was doing. No answer.

## 2018-11-17 DIAGNOSIS — R7303 Prediabetes: Secondary | ICD-10-CM | POA: Diagnosis not present

## 2018-12-08 ENCOUNTER — Telehealth: Payer: Self-pay | Admitting: Primary Care

## 2018-12-08 ENCOUNTER — Telehealth (INDEPENDENT_AMBULATORY_CARE_PROVIDER_SITE_OTHER): Payer: Self-pay | Admitting: *Deleted

## 2018-12-08 NOTE — Telephone Encounter (Addendum)
Patient states she has not necessarily a cyst but a hole that has moved a bit up from where she has went to get it drained. Patient states she is in slight pain and that it continues to drain and that the drainage has a smell. Patient is concerned that it might be infected. Patient states it has been going on for a few weeks. Please follow up

## 2018-12-09 NOTE — Telephone Encounter (Signed)
Left voicemail asking patient or guardian to return call to [facility]. Nat Christen, CMA

## 2018-12-09 NOTE — Telephone Encounter (Signed)
error 

## 2018-12-11 NOTE — Telephone Encounter (Signed)
Left second voicemail asking patient or guardian to return call to [facility] at [phone]. Nat Christen, CMA

## 2018-12-15 NOTE — Telephone Encounter (Signed)
Left message on voicemail to return call to [facility]- left number - [phone].

## 2018-12-17 ENCOUNTER — Telehealth: Payer: Self-pay | Admitting: *Deleted

## 2018-12-17 ENCOUNTER — Telehealth (INDEPENDENT_AMBULATORY_CARE_PROVIDER_SITE_OTHER): Payer: Self-pay | Admitting: "Endocrinology

## 2018-12-17 NOTE — Telephone Encounter (Signed)
Attempted to call, no answer and voicemail is not set up.  We have not seen patient in over 1 year. Dr. Tobe Sos did not give this medication. In Epic the provider that gave this medications is Sylvester Harder NP.

## 2018-12-17 NOTE — Telephone Encounter (Signed)
Cyst  Drainage can be evaluated and depending on size I can do a I&D but at this time unable to schedule . Speciality providers such as elective surgery, demister and dermatology are not accepting patients. We will schedule and appt as soon as available

## 2018-12-17 NOTE — Telephone Encounter (Signed)
Please call mom regarding ranitidine.  She saw on TV this medication has been recalled.  I told her that it looked as if Dr. Tobe Sos did not prescribe this medication.  She was very certain he did and would like someone to call her.

## 2018-12-17 NOTE — Telephone Encounter (Signed)
New Message  Pt's grandmother states she has been calling to speak with a nurse regarding  The pt's cyst draining under her arm, also states the pharmacy informed her that she might need to stop taking ranitidine (ZANTAC) 150 MG tablet. Please f/u

## 2018-12-18 NOTE — Telephone Encounter (Signed)
Another attempt made to speak with Elizabeth Howell. Left message on voicemail to return call.

## 2018-12-21 NOTE — Telephone Encounter (Signed)
Pt's grandma stated do she need to continue taking Ranitidine, Grandma stated they gave it to her because she was prediabetes. Pt's stated she want to know if she need to stop taking it or continue. Pt's grandma is aware it is a acid reflux medication.

## 2018-12-21 NOTE — Telephone Encounter (Signed)
Medication has been recalled . She may want to purchase Tums

## 2018-12-22 NOTE — Telephone Encounter (Signed)
CMA spoke to patient's grandma and inform on PCP advising.

## 2019-02-11 ENCOUNTER — Encounter: Payer: Self-pay | Admitting: Primary Care

## 2019-02-11 ENCOUNTER — Other Ambulatory Visit: Payer: Self-pay

## 2019-02-11 ENCOUNTER — Ambulatory Visit: Payer: Medicaid Other | Attending: Primary Care | Admitting: Primary Care

## 2019-02-11 VITALS — BP 107/78 | HR 75 | Temp 99.3°F | Resp 18 | Ht 62.0 in | Wt 258.0 lb

## 2019-02-11 DIAGNOSIS — T8130XA Disruption of wound, unspecified, initial encounter: Secondary | ICD-10-CM | POA: Diagnosis not present

## 2019-02-11 DIAGNOSIS — E785 Hyperlipidemia, unspecified: Secondary | ICD-10-CM | POA: Insufficient documentation

## 2019-02-11 DIAGNOSIS — E119 Type 2 diabetes mellitus without complications: Secondary | ICD-10-CM | POA: Diagnosis not present

## 2019-02-11 DIAGNOSIS — E8881 Metabolic syndrome: Secondary | ICD-10-CM

## 2019-02-11 DIAGNOSIS — Z79899 Other long term (current) drug therapy: Secondary | ICD-10-CM | POA: Diagnosis not present

## 2019-02-11 DIAGNOSIS — Z6841 Body Mass Index (BMI) 40.0 and over, adult: Secondary | ICD-10-CM | POA: Diagnosis not present

## 2019-02-11 DIAGNOSIS — I10 Essential (primary) hypertension: Secondary | ICD-10-CM

## 2019-02-11 DIAGNOSIS — Z7984 Long term (current) use of oral hypoglycemic drugs: Secondary | ICD-10-CM | POA: Insufficient documentation

## 2019-02-11 DIAGNOSIS — L732 Hidradenitis suppurativa: Secondary | ICD-10-CM | POA: Diagnosis not present

## 2019-02-11 DIAGNOSIS — Z131 Encounter for screening for diabetes mellitus: Secondary | ICD-10-CM | POA: Diagnosis not present

## 2019-02-11 DIAGNOSIS — L02412 Cutaneous abscess of left axilla: Secondary | ICD-10-CM | POA: Diagnosis not present

## 2019-02-11 LAB — POCT GLYCOSYLATED HEMOGLOBIN (HGB A1C): Hemoglobin A1C: 5.8 % — AB (ref 4.0–5.6)

## 2019-02-11 LAB — GLUCOSE, POCT (MANUAL RESULT ENTRY): POC Glucose: 93 mg/dl (ref 70–99)

## 2019-02-11 NOTE — Progress Notes (Signed)
Subjective:  Patient ID: Elizabeth Howell, female    DOB: [birth year]  Age: 23 y.o. MRN: [id number] CC:  Open area under left arm  HPI [name] presents for follow up on diabetes and a non healing area under left arm. PMH : hyperlipidemia, HTN , morbid obesity and hidradenitis.  Outpatient Medications Prior to Visit  Medication Sig Dispense Refill  . atorvastatin (LIPITOR) 40 MG tablet Take 1 tablet (40 mg total) by mouth daily. 90 tablet 3  . cholecalciferol (VITAMIN D) 1000 units tablet Take 1 tablet (1,000 Units total) by mouth daily. 90 tablet 3  . clindamycin (CLEOCIN) 300 MG capsule Take 1 capsule (300 mg total) by mouth 3 (three) times daily. 15 capsule 0  . metFORMIN (GLUCOPHAGE) 500 MG tablet Take 1 tablet (500 mg total) by mouth 2 (two) times daily with a meal. 180 tablet 3  . Omega-3 Fatty Acids (FISH OIL) 1000 MG CAPS Take 1 capsule (1,000 mg total) by mouth daily. 30 capsule 11  . ranitidine (ZANTAC) 150 MG tablet Take 1 tablet (150 mg total) by mouth 2 (two) times daily. 60 tablet 2  . sertraline (ZOLOFT) 100 MG tablet Take 1 tablet (100 mg total) by mouth daily. 90 tablet 1  . traMADol (ULTRAM) 50 MG tablet Take 1-2 tablets (50-100 mg total) by mouth every 6 (six) hours as needed. Take with food 15 tablet 0   No facility-administered medications prior to visit.     ROS Review of Systems  Constitutional: Negative.   HENT: Negative.   Eyes: Negative.   Respiratory: Negative.   Cardiovascular: Negative.   Gastrointestinal: Negative.   Endocrine: Negative.   Genitourinary: Negative.   Musculoskeletal: Negative.   Skin: Positive for wound.       Non healing wound previously I&D and surgically removed.  Neurological: Negative.   Hematological: Negative.   Psychiatric/Behavioral: Positive for sleep disturbance.    Objective:  BP 107/78 (BP Location: Right Arm, Patient Position: Sitting, Cuff Size: Large)   Pulse 75   Temp 99.3 F (37.4 C) (Oral)   Resp  18   Ht 5\' 2"  (1.575 m)   Wt 258 lb (117 kg)   LMP 01/19/2019   SpO2 97%   BMI 47.19 kg/m   BP/Weight 02/11/2019 11/02/2018 9/98/3382  Systolic BP 505 397 673  Diastolic BP 78 89 62  Wt. (Lbs) 258 - -  BMI 47.19 - -    Physical Exam Constitutional:      Appearance: She is obese.  HENT:     Head: Normocephalic and atraumatic.  Neck:     Musculoskeletal: Normal range of motion and neck supple.  Cardiovascular:     Rate and Rhythm: Regular rhythm.     Pulses: Normal pulses.     Heart sounds: Normal heart sounds.  Pulmonary:     Effort: Pulmonary effort is normal.     Breath sounds: Normal breath sounds.  Abdominal:     General: Bowel sounds are normal. There is distension.     Palpations: Abdomen is soft.  Musculoskeletal: Normal range of motion.  Skin:    General: Skin is warm and dry.  Neurological:     General: No focal deficit present.     Mental Status: She is alert and oriented to person, place, and time.  Psychiatric:        Mood and Affect: Mood normal.      Assessment & Plan:  Husna was seen today for diabetes.  Diagnoses and all  orders for this visit:  Insulin resistance -     HgB A1c 5.8 well controlled -     Glucose (CBG)  Wound dehiscence -     AMB referral to wound care center See attached pictures   Essential Hypertension  Well controlled    Follow-up: No follow-ups on file.   Kerin Perna NP

## 2019-02-12 ENCOUNTER — Encounter: Payer: Self-pay | Admitting: Primary Care

## 2019-02-24 ENCOUNTER — Other Ambulatory Visit: Payer: Self-pay

## 2019-02-24 ENCOUNTER — Encounter (HOSPITAL_BASED_OUTPATIENT_CLINIC_OR_DEPARTMENT_OTHER): Payer: Medicaid Other | Attending: Physician Assistant

## 2019-02-24 DIAGNOSIS — T8189XA Other complications of procedures, not elsewhere classified, initial encounter: Secondary | ICD-10-CM | POA: Diagnosis not present

## 2019-02-24 DIAGNOSIS — L98492 Non-pressure chronic ulcer of skin of other sites with fat layer exposed: Secondary | ICD-10-CM | POA: Insufficient documentation

## 2019-02-24 DIAGNOSIS — L02412 Cutaneous abscess of left axilla: Secondary | ICD-10-CM | POA: Diagnosis not present

## 2019-03-03 DIAGNOSIS — L98492 Non-pressure chronic ulcer of skin of other sites with fat layer exposed: Secondary | ICD-10-CM | POA: Diagnosis not present

## 2019-03-03 DIAGNOSIS — L02412 Cutaneous abscess of left axilla: Secondary | ICD-10-CM | POA: Diagnosis not present

## 2019-03-10 DIAGNOSIS — L02412 Cutaneous abscess of left axilla: Secondary | ICD-10-CM | POA: Diagnosis not present

## 2019-03-10 DIAGNOSIS — T8189XA Other complications of procedures, not elsewhere classified, initial encounter: Secondary | ICD-10-CM | POA: Diagnosis not present

## 2019-03-10 DIAGNOSIS — L98492 Non-pressure chronic ulcer of skin of other sites with fat layer exposed: Secondary | ICD-10-CM | POA: Diagnosis not present

## 2019-03-24 ENCOUNTER — Encounter (HOSPITAL_BASED_OUTPATIENT_CLINIC_OR_DEPARTMENT_OTHER): Payer: Medicaid Other | Attending: Physician Assistant

## 2019-03-24 DIAGNOSIS — L98492 Non-pressure chronic ulcer of skin of other sites with fat layer exposed: Secondary | ICD-10-CM | POA: Diagnosis not present

## 2019-04-07 DIAGNOSIS — L98492 Non-pressure chronic ulcer of skin of other sites with fat layer exposed: Secondary | ICD-10-CM | POA: Diagnosis not present

## 2019-04-07 DIAGNOSIS — T8189XA Other complications of procedures, not elsewhere classified, initial encounter: Secondary | ICD-10-CM | POA: Diagnosis not present

## 2019-04-12 DIAGNOSIS — F3341 Major depressive disorder, recurrent, in partial remission: Secondary | ICD-10-CM | POA: Diagnosis not present

## 2019-04-21 ENCOUNTER — Encounter (HOSPITAL_BASED_OUTPATIENT_CLINIC_OR_DEPARTMENT_OTHER): Payer: Medicaid Other | Attending: Internal Medicine

## 2019-04-21 DIAGNOSIS — L98492 Non-pressure chronic ulcer of skin of other sites with fat layer exposed: Secondary | ICD-10-CM | POA: Insufficient documentation

## 2019-05-05 DIAGNOSIS — L98492 Non-pressure chronic ulcer of skin of other sites with fat layer exposed: Secondary | ICD-10-CM | POA: Diagnosis not present

## 2019-05-05 DIAGNOSIS — T8189XA Other complications of procedures, not elsewhere classified, initial encounter: Secondary | ICD-10-CM | POA: Diagnosis not present

## 2019-05-19 ENCOUNTER — Encounter (HOSPITAL_BASED_OUTPATIENT_CLINIC_OR_DEPARTMENT_OTHER): Payer: Medicaid Other | Attending: Physician Assistant

## 2019-05-19 DIAGNOSIS — L98492 Non-pressure chronic ulcer of skin of other sites with fat layer exposed: Secondary | ICD-10-CM | POA: Insufficient documentation

## 2019-05-21 ENCOUNTER — Other Ambulatory Visit: Payer: Self-pay

## 2019-05-26 DIAGNOSIS — L98492 Non-pressure chronic ulcer of skin of other sites with fat layer exposed: Secondary | ICD-10-CM | POA: Diagnosis not present

## 2019-06-04 DIAGNOSIS — Z23 Encounter for immunization: Secondary | ICD-10-CM | POA: Diagnosis not present

## 2019-06-09 DIAGNOSIS — L98499 Non-pressure chronic ulcer of skin of other sites with unspecified severity: Secondary | ICD-10-CM | POA: Diagnosis not present

## 2019-06-09 DIAGNOSIS — L02414 Cutaneous abscess of left upper limb: Secondary | ICD-10-CM | POA: Diagnosis not present

## 2019-06-09 DIAGNOSIS — L98492 Non-pressure chronic ulcer of skin of other sites with fat layer exposed: Secondary | ICD-10-CM | POA: Diagnosis not present

## 2019-07-27 DIAGNOSIS — F3341 Major depressive disorder, recurrent, in partial remission: Secondary | ICD-10-CM | POA: Diagnosis not present

## 2019-08-04 ENCOUNTER — Encounter (INDEPENDENT_AMBULATORY_CARE_PROVIDER_SITE_OTHER): Payer: Self-pay | Admitting: Primary Care

## 2019-08-04 ENCOUNTER — Ambulatory Visit (INDEPENDENT_AMBULATORY_CARE_PROVIDER_SITE_OTHER): Payer: Medicaid Other | Admitting: Primary Care

## 2019-08-04 ENCOUNTER — Other Ambulatory Visit: Payer: Self-pay

## 2019-08-04 VITALS — BP 100/78 | HR 96 | Temp 97.9°F | Resp 20 | Ht 63.5 in | Wt 258.2 lb

## 2019-08-04 DIAGNOSIS — Z23 Encounter for immunization: Secondary | ICD-10-CM

## 2019-08-04 DIAGNOSIS — Z6841 Body Mass Index (BMI) 40.0 and over, adult: Secondary | ICD-10-CM | POA: Diagnosis not present

## 2019-08-04 DIAGNOSIS — R7303 Prediabetes: Secondary | ICD-10-CM | POA: Diagnosis not present

## 2019-08-04 DIAGNOSIS — Z760 Encounter for issue of repeat prescription: Secondary | ICD-10-CM | POA: Diagnosis not present

## 2019-08-04 DIAGNOSIS — Z708 Other sex counseling: Secondary | ICD-10-CM | POA: Diagnosis not present

## 2019-08-04 DIAGNOSIS — D367 Benign neoplasm of other specified sites: Secondary | ICD-10-CM

## 2019-08-04 LAB — POCT GLYCOSYLATED HEMOGLOBIN (HGB A1C): Hemoglobin A1C: 5.6 % (ref 4.0–5.6)

## 2019-08-04 LAB — GLUCOSE, POCT (MANUAL RESULT ENTRY): POC Glucose: 92 mg/dl (ref 70–99)

## 2019-08-04 MED ORDER — METFORMIN HCL 500 MG PO TABS: 500.0000 mg | ORAL_TABLET | Freq: Every day | ORAL | 1 refills | Status: DC

## 2019-08-04 NOTE — Progress Notes (Signed)
Abscess under right arm, started with pain and opened up 1 week ago.  Went to wound care for other wounds.  Denies drainage but very tender to touch   Established Patient Office Visit  Subjective:  Patient ID: Elizabeth Howell, female    DOB: 18-Sep-1995  Age: 23 y.o. MRN: [id number]  CC: No chief complaint on file.   HPI Alan Riles presents for abscess under right arm, started with pain and opened up 1 week ago.  She has previously seen at the wound care for abscess under left arm approximately 1 month ago drained and healed.   Past Medical History:  Diagnosis Date  . ADHD (attention deficit hyperactivity disorder)    no current med.  . Depression   . Hidradenitis suppurativa of left axilla 07/2017  . History of asthma    no current med.  . Hyperlipidemia   . Nocturia   . Pre-diabetes     Past Surgical History:  Procedure Laterality Date  . HYDRADENITIS EXCISION Left 07/30/2017   Procedure: EXCISION LEFT AXILLA HIDRADENITIS;  Surgeon: Erroll Luna, MD;  Location: [facility];  Service: General;  Laterality: Left;  . WISDOM TOOTH EXTRACTION      Family History  Problem Relation Age of Onset  . Hypertension Maternal Grandfather   . Diabetes Other     Social History   Socioeconomic History  . Marital status: Single    Spouse name: Not on file  . Number of children: Not on file  . Years of education: Not on file  . Highest education level: Not on file  Occupational History  . Not on file  Social Needs  . Financial resource strain: Not on file  . Food insecurity    Worry: Not on file    Inability: Not on file  . Transportation needs    Medical: Not on file    Non-medical: Not on file  Tobacco Use  . Smoking status: Never Smoker  . Smokeless tobacco: Never Used  Substance and Sexual Activity  . Alcohol use: No    Alcohol/week: 0.0 standard drinks    Frequency: Never  . Drug use: No  . Sexual activity: Not on file  Lifestyle  .  Physical activity    Days per week: Not on file    Minutes per session: Not on file  . Stress: Not on file  Relationships  . Social Herbalist on phone: Not on file    Gets together: Not on file    Attends religious service: Not on file    Active member of club or organization: Not on file    Attends meetings of clubs or organizations: Not on file    Relationship status: Not on file  . Intimate partner violence    Fear of current or ex partner: Not on file    Emotionally abused: Not on file    Physically abused: Not on file    Forced sexual activity: Not on file  Other Topics Concern  . Not on file  Social History Narrative   Is in 9th grade at [employer] Medications Prior to Visit  Medication Sig Dispense Refill  . atorvastatin (LIPITOR) 40 MG tablet Take 1 tablet (40 mg total) by mouth daily. 90 tablet 3  . cholecalciferol (VITAMIN D) 1000 units tablet Take 1 tablet (1,000 Units total) by mouth daily. 90 tablet 3  . Omega-3 Fatty Acids (FISH OIL) 1000 MG CAPS Take 1  capsule (1,000 mg total) by mouth daily. 30 capsule 11  . ranitidine (ZANTAC) 150 MG tablet Take 1 tablet (150 mg total) by mouth 2 (two) times daily. 60 tablet 2  . sertraline (ZOLOFT) 100 MG tablet Take 1 tablet (100 mg total) by mouth daily. 90 tablet 1  . metFORMIN (GLUCOPHAGE) 500 MG tablet Take 1 tablet (500 mg total) by mouth 2 (two) times daily with a meal. 180 tablet 3  . traMADol (ULTRAM) 50 MG tablet Take 1-2 tablets (50-100 mg total) by mouth every 6 (six) hours as needed. Take with food (Patient not taking: Reported on 08/04/2019) 15 tablet 0  . clindamycin (CLEOCIN) 300 MG capsule Take 1 capsule (300 mg total) by mouth 3 (three) times daily. (Patient not taking: Reported on 08/04/2019) 15 capsule 0   No facility-administered medications prior to visit.     No Known Allergies  ROS Review of Systems  Skin:       Cyst under right arm  All other systems reviewed and are  negative.     Objective:    Physical Exam  Constitutional: She is oriented to person, place, and time. She appears well-developed and well-nourished.  HENT:  Head: Normocephalic.  Neck: Neck supple.  Abdominal: Soft. Bowel sounds are normal.  Musculoskeletal: Normal range of motion.  Neurological: She is alert and oriented to person, place, and time.  Skin:  cyst  Psychiatric: She has a normal mood and affect. Her behavior is normal.    BP 100/78 (BP Location: Left Arm, Patient Position: Sitting, Cuff Size: Large)   Pulse 96   Temp 97.9 F (36.6 C) (Oral)   Resp 20   Ht 5' 3.5" (1.613 m)   Wt 258 lb 3.2 oz (117.1 kg)   LMP 07/21/2019 (Approximate)   SpO2 99%   BMI 45.02 kg/m  Wt Readings from Last 3 Encounters:  08/04/19 258 lb 3.2 oz (117.1 kg)  02/11/19 258 lb (117 kg)  07/20/18 257 lb 12.8 oz (116.9 kg)     Health Maintenance Due  Topic Date Due  . PNEUMOCOCCAL POLYSACCHARIDE VACCINE AGE 42-64 HIGH RISK  03/11/2000  . OPHTHALMOLOGY EXAM  03/11/2008  . URINE MICROALBUMIN  03/11/2008  . FOOT EXAM  07/17/2018  . PAP-Cervical Cytology Screening  03/12/2019  . PAP SMEAR-Modifier  03/12/2019    There are no preventive care reminders to display for this patient.  Lab Results  Component Value Date   TSH 3.650 07/20/2018   Lab Results  Component Value Date   WBC 10.5 07/20/2018   HGB 12.9 07/20/2018   HCT 38.7 07/20/2018   MCV 85 07/20/2018   PLT 381 07/20/2018   Lab Results  Component Value Date   NA 141 07/20/2018   K 4.1 07/20/2018   CO2 22 07/20/2018   GLUCOSE 104 (H) 07/20/2018   BUN 12 07/20/2018   CREATININE 0.66 07/20/2018   BILITOT <0.2 07/20/2018   ALKPHOS 116 07/20/2018   AST 20 07/20/2018   ALT 22 07/20/2018   PROT 7.1 07/20/2018   ALBUMIN 4.2 07/20/2018   CALCIUM 9.7 07/20/2018   Lab Results  Component Value Date   CHOL 242 (H) 07/20/2018   Lab Results  Component Value Date   HDL 40 07/20/2018   Lab Results  Component Value  Date   LDLCALC 163 (H) 07/20/2018   Lab Results  Component Value Date   TRIG 195 (H) 07/20/2018   Lab Results  Component Value Date   CHOLHDL 6.1 (H) 07/20/2018  Lab Results  Component Value Date   HGBA1C 5.6 08/04/2019      Assessment & Plan:  Diagnoses and all orders for this visit:  Prediabetes A1C is 5.6 she is on metformin 523m twice daily. Would like to try lifestyle medication with diet and exercise but she is not motivated to exercise. Reduce metformin 5054mdaily. -     Glucose (CBG) -     HgB A1c -     Cancel: CBC with Differential; Future -     Cancel: Comp Met (CMET) future; Future -     Cancel: Lipid panel; Future -     CBC with Differential -     Lipid panel -     Complete Metabolic Panel with GFR -     Ambulatory referral to Ophthalmology  Medication refill -     metFORMIN (GLUCOPHAGE) 500 MG tablet; Take 1 tablet (500 mg total) by mouth daily with breakfast.  Morbid obesity ([facility]bid Obesity is BMI > 40 indicating an excess in caloric intake or underlining conditions. This may lead to other co-morbidities examples are CVD, increase risk of stroke of heart attack  . Lifestyle modifications of diet and exercise may reduce obesity.   Sexually transmitted disease counseling Pt counseled regarding condom use with each sexual activity to promote wellness and prevention of transmission of HIV, syphilis, herpes simplex virus, gonorrhea, chlamydia and trichomoniasis..  Reliable web sites such as cdStoreMirror.com.cynd thebody.com were given to patient since  preferences for learning is the Internet.   Cyst, dermoid, arm, right Unable to find record of cyst under the other arm- there is a small pea felt area in arm pit area not open or signs of infection monitor and do not shave under her arms use clinical sensitive deodorant.   Meds ordered this encounter  Medications  . metFORMIN (GLUCOPHAGE) 500 MG tablet    Sig: Take 1 tablet (500 mg total) by mouth daily with  breakfast.    Dispense:  90 tablet    Refill:  1    Follow-up: Return in about 6 months (around 02/01/2020) for Change metformin to once daily.    MiKerin PernaNP

## 2019-08-04 NOTE — Patient Instructions (Signed)

## 2019-08-05 LAB — CMP14+EGFR
ALT: 23 IU/L (ref 0–32)
AST: 21 IU/L (ref 0–40)
Albumin/Globulin Ratio: 1.2 (ref 1.2–2.2)
Albumin: 4.1 g/dL (ref 3.9–5.0)
Alkaline Phosphatase: 127 IU/L — ABNORMAL HIGH (ref 39–117)
BUN/Creatinine Ratio: 10 (ref 9–23)
BUN: 7 mg/dL (ref 6–20)
Bilirubin Total: 0.4 mg/dL (ref 0.0–1.2)
CO2: 23 mmol/L (ref 20–29)
Calcium: 9.7 mg/dL (ref 8.7–10.2)
Chloride: 101 mmol/L (ref 96–106)
Creatinine, Ser: 0.73 mg/dL (ref 0.57–1.00)
GFR calc Af Amer: 136 mL/min/{1.73_m2} (ref >59)
GFR calc non Af Amer: 118 mL/min/{1.73_m2} (ref >59)
Globulin, Total: 3.5 g/dL (ref 1.5–4.5)
Glucose: 97 mg/dL (ref 65–99)
Potassium: 4.3 mmol/L (ref 3.5–5.2)
Sodium: 141 mmol/L (ref 134–144)
Total Protein: 7.6 g/dL (ref 6.0–8.5)

## 2019-08-05 LAB — CBC WITH DIFFERENTIAL/PLATELET
Basophils Absolute: 0 10*3/uL (ref 0.0–0.2)
Basos: 0 %
EOS (ABSOLUTE): 0.2 10*3/uL (ref 0.0–0.4)
Eos: 2 %
Hematocrit: 42 % (ref 34.0–46.6)
Hemoglobin: 14.6 g/dL (ref 11.1–15.9)
Immature Grans (Abs): 0 10*3/uL (ref 0.0–0.1)
Immature Granulocytes: 0 %
Lymphocytes Absolute: 1.8 10*3/uL (ref 0.7–3.1)
Lymphs: 20 %
MCH: 29.4 pg (ref 26.6–33.0)
MCHC: 34.8 g/dL (ref 31.5–35.7)
MCV: 85 fL (ref 79–97)
Monocytes Absolute: 0.4 10*3/uL (ref 0.1–0.9)
Monocytes: 4 %
Neutrophils Absolute: 6.7 10*3/uL (ref 1.4–7.0)
Neutrophils: 74 %
Platelets: 342 10*3/uL (ref 150–450)
RBC: 4.97 x10E6/uL (ref 3.77–5.28)
RDW: 13.7 % (ref 11.7–15.4)
WBC: 9.2 10*3/uL (ref 3.4–10.8)

## 2019-08-05 LAB — LIPID PANEL
Chol/HDL Ratio: 7.2 ratio — ABNORMAL HIGH (ref 0.0–4.4)
Cholesterol, Total: 294 mg/dL — ABNORMAL HIGH (ref 100–199)
HDL: 41 mg/dL (ref >39)
LDL Chol Calc (NIH): 230 mg/dL — ABNORMAL HIGH (ref 0–99)
Triglycerides: 126 mg/dL (ref 0–149)
VLDL Cholesterol Cal: 23 mg/dL (ref 5–40)

## 2019-08-10 DIAGNOSIS — F3341 Major depressive disorder, recurrent, in partial remission: Secondary | ICD-10-CM | POA: Diagnosis not present

## 2019-08-16 ENCOUNTER — Other Ambulatory Visit (INDEPENDENT_AMBULATORY_CARE_PROVIDER_SITE_OTHER): Payer: Self-pay | Admitting: Primary Care

## 2019-08-16 DIAGNOSIS — E7841 Elevated Lipoprotein(a): Secondary | ICD-10-CM

## 2019-08-16 MED ORDER — ATORVASTATIN CALCIUM 40 MG PO TABS: 40.0000 mg | ORAL_TABLET | Freq: Every day | ORAL | 3 refills | Status: DC

## 2019-09-14 DIAGNOSIS — F3341 Major depressive disorder, recurrent, in partial remission: Secondary | ICD-10-CM | POA: Diagnosis not present

## 2019-09-28 ENCOUNTER — Encounter: Payer: Self-pay | Admitting: Primary Care

## 2019-09-28 DIAGNOSIS — H04123 Dry eye syndrome of bilateral lacrimal glands: Secondary | ICD-10-CM | POA: Diagnosis not present

## 2019-09-28 DIAGNOSIS — E119 Type 2 diabetes mellitus without complications: Secondary | ICD-10-CM | POA: Diagnosis not present

## 2019-09-28 DIAGNOSIS — H0288A Meibomian gland dysfunction right eye, upper and lower eyelids: Secondary | ICD-10-CM | POA: Diagnosis not present

## 2019-09-28 DIAGNOSIS — H0288B Meibomian gland dysfunction left eye, upper and lower eyelids: Secondary | ICD-10-CM | POA: Diagnosis not present

## 2019-09-28 DIAGNOSIS — H40023 Open angle with borderline findings, high risk, bilateral: Secondary | ICD-10-CM | POA: Diagnosis not present

## 2019-09-28 LAB — HM DIABETES EYE EXAM

## 2019-10-05 DIAGNOSIS — F3341 Major depressive disorder, recurrent, in partial remission: Secondary | ICD-10-CM | POA: Diagnosis not present

## 2019-10-05 DIAGNOSIS — F900 Attention-deficit hyperactivity disorder, predominantly inattentive type: Secondary | ICD-10-CM | POA: Diagnosis not present

## 2019-10-14 DIAGNOSIS — H04123 Dry eye syndrome of bilateral lacrimal glands: Secondary | ICD-10-CM | POA: Diagnosis not present

## 2019-10-14 DIAGNOSIS — H0288B Meibomian gland dysfunction left eye, upper and lower eyelids: Secondary | ICD-10-CM | POA: Diagnosis not present

## 2019-10-14 DIAGNOSIS — H0288A Meibomian gland dysfunction right eye, upper and lower eyelids: Secondary | ICD-10-CM | POA: Diagnosis not present

## 2019-10-14 DIAGNOSIS — H40023 Open angle with borderline findings, high risk, bilateral: Secondary | ICD-10-CM | POA: Diagnosis not present

## 2019-11-15 DIAGNOSIS — H40023 Open angle with borderline findings, high risk, bilateral: Secondary | ICD-10-CM | POA: Diagnosis not present

## 2019-11-15 DIAGNOSIS — H0288B Meibomian gland dysfunction left eye, upper and lower eyelids: Secondary | ICD-10-CM | POA: Diagnosis not present

## 2019-11-15 DIAGNOSIS — H04123 Dry eye syndrome of bilateral lacrimal glands: Secondary | ICD-10-CM | POA: Diagnosis not present

## 2019-11-15 DIAGNOSIS — H0288A Meibomian gland dysfunction right eye, upper and lower eyelids: Secondary | ICD-10-CM | POA: Diagnosis not present

## 2019-11-18 DIAGNOSIS — F900 Attention-deficit hyperactivity disorder, predominantly inattentive type: Secondary | ICD-10-CM | POA: Diagnosis not present

## 2019-11-18 DIAGNOSIS — F3341 Major depressive disorder, recurrent, in partial remission: Secondary | ICD-10-CM | POA: Diagnosis not present

## 2019-12-21 DIAGNOSIS — F900 Attention-deficit hyperactivity disorder, predominantly inattentive type: Secondary | ICD-10-CM | POA: Diagnosis not present

## 2019-12-21 DIAGNOSIS — F3341 Major depressive disorder, recurrent, in partial remission: Secondary | ICD-10-CM | POA: Diagnosis not present

## 2019-12-28 ENCOUNTER — Telehealth (INDEPENDENT_AMBULATORY_CARE_PROVIDER_SITE_OTHER): Payer: Medicaid Other | Admitting: Primary Care

## 2019-12-28 DIAGNOSIS — L2389 Allergic contact dermatitis due to other agents: Secondary | ICD-10-CM | POA: Diagnosis not present

## 2019-12-28 NOTE — Progress Notes (Signed)
Virtual Visit via Telephone Note  I connected with Elizabeth Howell on 12/28/19 at  4:10 PM EDT by telephone and verified that I am speaking with the correct person using two identifiers.   I discussed the limitations, risks, security and privacy concerns of performing an evaluation and management service by telephone and the availability of in person appointments. I also discussed with the patient that there may be a patient responsible charge related to this service. The patient expressed understanding and agreed to proceed.   History of Present Illness: Elizabeth Howell is having a video visit for she is a little under the weather from the second vaccine . Her arm is a little sore (left) 1 weeks she has been "super itchy" she admits to using her bother body wash.   Past Medical History:  Diagnosis Date  . ADHD (attention deficit hyperactivity disorder)    no current med.  . Depression   . Hidradenitis suppurativa of left axilla 07/2017  . History of asthma    no current med.  . Hyperlipidemia   . Nocturia   . Pre-diabetes    Current Outpatient Medications on File Prior to Visit  Medication Sig Dispense Refill  . atorvastatin (LIPITOR) 40 MG tablet Take 1 tablet (40 mg total) by mouth daily. 90 tablet 3  . cholecalciferol (VITAMIN D) 1000 units tablet Take 1 tablet (1,000 Units total) by mouth daily. 90 tablet 3  . metFORMIN (GLUCOPHAGE) 500 MG tablet Take 1 tablet (500 mg total) by mouth daily with breakfast. 90 tablet 1  . Omega-3 Fatty Acids (FISH OIL) 1000 MG CAPS Take 1 capsule (1,000 mg total) by mouth daily. 30 capsule 11  . ranitidine (ZANTAC) 150 MG tablet Take 1 tablet (150 mg total) by mouth 2 (two) times daily. 60 tablet 2  . sertraline (ZOLOFT) 100 MG tablet Take 1 tablet (100 mg total) by mouth daily. 90 tablet 1  . traMADol (ULTRAM) 50 MG tablet Take 1-2 tablets (50-100 mg total) by mouth every 6 (six) hours as needed. Take with food (Patient not taking:  Reported on 08/04/2019) 15 tablet 0   No current facility-administered medications on file prior to visit.   Observations/Objective: Review of Systems  Skin: Positive for itching.  All other systems reviewed and are negative.   Assessment and Plan: Diagnoses and all orders for this visit:  Allergic contact dermatitis due to other agents Only new product she used was her brothers body wash. Discussed irritants that can cause pruritus change body wash, detergents, deodorants. Advised to wear sox on her hands at night during her sleep she scratches and wakes up with blood from scratching ng use lotions cream examples Cetaphil, Eucerin and Aveeno the cream to help with dry skin.  Other orders -     hydrOXYzine (ATARAX/VISTARIL) 10 MG tablet; Take 1 tablet (10 mg total) by mouth 3 (three) times daily as needed. -     levocetirizine (XYZAL) 5 MG tablet; Take 1 tablet (5 mg total) by mouth every evening.    Follow Up Instructions:    I discussed the assessment and treatment plan with the patient. The patient was provided an opportunity to ask questions and all were answered. The patient agreed with the plan and demonstrated an understanding of the instructions.   The patient was advised to call back or seek an in-person evaluation if the symptoms worsen or if the condition fails to improve as anticipated.  I provided 10 minutes of non-face-to-face time during this  encounter.   Kerin Perna, NP

## 2019-12-29 MED ORDER — LEVOCETIRIZINE DIHYDROCHLORIDE 5 MG PO TABS: 5.0000 mg | ORAL_TABLET | Freq: Every evening | ORAL | 3 refills | Status: DC

## 2019-12-29 MED ORDER — HYDROXYZINE HCL 10 MG PO TABS: 10.0000 mg | ORAL_TABLET | Freq: Three times a day (TID) | ORAL | 0 refills | Status: DC | PRN

## 2020-01-25 ENCOUNTER — Other Ambulatory Visit (INDEPENDENT_AMBULATORY_CARE_PROVIDER_SITE_OTHER): Payer: Self-pay | Admitting: Primary Care

## 2020-03-02 DIAGNOSIS — F900 Attention-deficit hyperactivity disorder, predominantly inattentive type: Secondary | ICD-10-CM | POA: Diagnosis not present

## 2020-03-02 DIAGNOSIS — F3341 Major depressive disorder, recurrent, in partial remission: Secondary | ICD-10-CM | POA: Diagnosis not present

## 2020-03-10 ENCOUNTER — Ambulatory Visit (INDEPENDENT_AMBULATORY_CARE_PROVIDER_SITE_OTHER): Payer: Medicaid Other | Admitting: Primary Care

## 2020-03-10 ENCOUNTER — Other Ambulatory Visit: Payer: Self-pay

## 2020-03-10 ENCOUNTER — Encounter (INDEPENDENT_AMBULATORY_CARE_PROVIDER_SITE_OTHER): Payer: Self-pay | Admitting: Primary Care

## 2020-03-10 VITALS — BP 109/75 | HR 95 | Temp 97.2°F | Resp 16 | Ht 63.5 in | Wt 261.0 lb

## 2020-03-10 DIAGNOSIS — E119 Type 2 diabetes mellitus without complications: Secondary | ICD-10-CM

## 2020-03-10 DIAGNOSIS — E7841 Elevated Lipoprotein(a): Secondary | ICD-10-CM | POA: Diagnosis not present

## 2020-03-10 DIAGNOSIS — Z0189 Encounter for other specified special examinations: Secondary | ICD-10-CM

## 2020-03-10 DIAGNOSIS — Z23 Encounter for immunization: Secondary | ICD-10-CM

## 2020-03-10 DIAGNOSIS — R7303 Prediabetes: Secondary | ICD-10-CM

## 2020-03-10 DIAGNOSIS — Z760 Encounter for issue of repeat prescription: Secondary | ICD-10-CM

## 2020-03-10 DIAGNOSIS — Z0000 Encounter for general adult medical examination without abnormal findings: Secondary | ICD-10-CM | POA: Diagnosis not present

## 2020-03-10 LAB — POCT GLYCOSYLATED HEMOGLOBIN (HGB A1C): Hemoglobin A1C: 5.7 % — AB (ref 4.0–5.6)

## 2020-03-10 MED ORDER — METFORMIN HCL 500 MG PO TABS: 500.0000 mg | ORAL_TABLET | Freq: Every day | ORAL | 1 refills | Status: DC

## 2020-03-10 MED ORDER — ATORVASTATIN CALCIUM 40 MG PO TABS: 40.0000 mg | ORAL_TABLET | Freq: Every day | ORAL | 3 refills | Status: DC

## 2020-03-10 NOTE — Progress Notes (Signed)
Elizabeth Howell is a 24 y.o. female presents to office today for annual physical exam examination.    Concerns today include: 1. None  Occupation: Glass blower/designer , Marital status:single , Substance use: none Diet: unhealthy, Exercise:walking wvery other day  Last eye exam: 11/2019 Last dental exam: unknown Refills needed today:metforminImmunizations needed: Flu Vaccine: no  Tdap Vaccine: yes  - every 32yrs - (<3 lifetime doses or unknown): all wounds -- look up need for Tetanus IG - (>=3 lifetime doses): clean/minor wound if >26yrs from previous; all other wounds if >66yrs from previous Zoster Vaccine: no (those >50yo, once) Pneumonia Vaccine: yes (those w/ risk factors) - (<64yr) Both: Immunocompromised, cochlear implant, CSF leak, asplenic, sickle cell, Chronic Renal Failure - (<81yr) PPSV-23 only: Heart dz, lung disease, DM, tobacco abuse, alcoholism, cirrhosis/liver disease. - (>72yr): PPSV13 then PPSV23 in 6-12mths;  - (>61yr): repeat PPSV23 once if pt received prior to 24yo and 46yrs have passed  Past Medical History:  Diagnosis Date  . ADHD (attention deficit hyperactivity disorder)    no current med.  . Depression   . Hidradenitis suppurativa of left axilla 07/2017  . History of asthma    no current med.  . Hyperlipidemia   . Nocturia   . Pre-diabetes    Social History   Socioeconomic History  . Marital status: Single    Spouse name: Not on file  . Number of children: Not on file  . Years of education: Not on file  . Highest education level: Not on file  Occupational History  . Not on file  Tobacco Use  . Smoking status: Never Smoker  . Smokeless tobacco: Never Used  Vaping Use  . Vaping Use: Never used  Substance and Sexual Activity  . Alcohol use: No    Alcohol/week: 0.0 standard drinks  . Drug use: No  . Sexual activity: Not on file  Other Topics Concern  . Not on file  Social History Narrative   Is in 9th grade at  [employer] Strain:   . Difficulty of Paying Living Expenses:   Food Insecurity:   . Worried About Charity fundraiser in the Last Year:   . Arboriculturist in the Last Year:   Transportation Needs:   . Film/video editor (Medical):   Marland Kitchen Lack of Transportation (Non-Medical):   Physical Activity:   . Days of Exercise per Week:   . Minutes of Exercise per Session:   Stress:   . Feeling of Stress :   Social Connections:   . Frequency of Communication with Friends and Family:   . Frequency of Social Gatherings with Friends and Family:   . Attends Religious Services:   . Active Member of Clubs or Organizations:   . Attends Archivist Meetings:   Marland Kitchen Marital Status:   Intimate Partner Violence:   . Fear of Current or Ex-Partner:   . Emotionally Abused:   Marland Kitchen Physically Abused:   . Sexually Abused:    Past Surgical History:  Procedure Laterality Date  . HYDRADENITIS EXCISION Left 07/30/2017   Procedure: EXCISION LEFT AXILLA HIDRADENITIS;  Surgeon: Erroll Luna, MD;  Location: [facility];  Service: General;  Laterality: Left;  . WISDOM TOOTH EXTRACTION     Family History  Problem Relation Age of Onset  . Hypertension Maternal Grandfather   . Diabetes Other     Current Outpatient Medications:  .  atorvastatin (LIPITOR) 40 MG tablet, Take 1 tablet (40 mg total) by mouth daily., Disp: 90 tablet, Rfl: 3 .  cholecalciferol (VITAMIN D) 1000 units tablet, Take 1 tablet (1,000 Units total) by mouth daily., Disp: 90 tablet, Rfl: 3 .  metFORMIN (GLUCOPHAGE) 500 MG tablet, Take 1 tablet (500 mg total) by mouth daily with breakfast., Disp: 90 tablet, Rfl: 1 .  Omega-3 Fatty Acids (FISH OIL) 1000 MG CAPS, Take 1 capsule (1,000 mg total) by mouth daily., Disp: 30 capsule, Rfl: 11 .  sertraline (ZOLOFT) 100 MG tablet, Take 1 tablet (100 mg total) by mouth daily., Disp: 90 tablet, Rfl: 1  No Known Allergies    ROS: Review of Systems A comprehensive review of systems was negative  Physical exam  General: Vital signs reviewed.  Patient is well-developed and well-nourished, in no acute distress and cooperative with exam.  Head: Normocephalic and atraumatic. Eyes: EOMI, conjunctivae normal, no scleral icterus.  Neck: Supple, trachea midline, normal ROM, no JVD, masses, thyromegaly, or carotid bruit present.  Cardiovascular: RRR, S1 normal, S2 normal, no murmurs, gallops, or rubs. Pulmonary/Chest: Clear to auscultation bilaterally, no wheezes, rales, or rhonchi. Abdominal: Soft, non-tender, non-distended, BS +, no masses, organomegaly, or guarding present.  Musculoskeletal: No joint deformities, erythema, or stiffness, ROM full and nontender. Extremities: No lower extremity edema bilaterally,  pulses symmetric and intact bilaterally. No cyanosis or clubbing. Neurological: A&O x3, Strength is normal and symmetric bilaterally, cranial nerve II-XII are grossly intact, no focal motor deficit, sensory intact to light touch bilaterally.  Skin: Warm, dry and intact. No rashes or erythema. Psychiatric: Normal mood and affect. speech and behavior is normal. Cognition and memory are normal.    Assessment/ Plan: Elizabeth Howell here for annual physical exam.   No problem-specific Assessment & Plan notes found for this encounter.   Counseled on healthy lifestyle choices, including diet (rich in fruits, vegetables and lean meats and low in salt and simple carbohydrates) and exercise (at least 30 minutes of moderate physical activity daily).  Patient to follow up in 1 year for annual exam or sooner if needed.  The above assessment and management plan was discussed with the patient. The patient verbalized understanding of and has agreed to the management plan. Patient is aware to call the clinic if symptoms persist or worsen. Patient is aware when to return to the clinic for a follow-up visit. Patient  educated on when it is appropriate to go to the emergency department.   Elizabeth Mire NP-C [address] [place] [phone]

## 2020-03-10 NOTE — Patient Instructions (Signed)
Health Maintenance, Female Adopting a healthy lifestyle and getting preventive care are important in promoting health and wellness. Ask your health care provider about:  The right schedule for you to have regular tests and exams.  Things you can do on your own to prevent diseases and keep yourself healthy. What should I know about diet, weight, and exercise? Eat a healthy diet   Eat a diet that includes plenty of vegetables, fruits, low-fat dairy products, and lean protein.  Do not eat a lot of foods that are high in solid fats, added sugars, or sodium. Maintain a healthy weight Body mass index (BMI) is used to identify weight problems. It estimates body fat based on height and weight. Your health care provider can help determine your BMI and help you achieve or maintain a healthy weight. Get regular exercise Get regular exercise. This is one of the most important things you can do for your health. Most adults should:  Exercise for at least 150 minutes each week. The exercise should increase your heart rate and make you sweat (moderate-intensity exercise).  Do strengthening exercises at least twice a week. This is in addition to the moderate-intensity exercise.  Spend less time sitting. Even light physical activity can be beneficial. Watch cholesterol and blood lipids Have your blood tested for lipids and cholesterol at 22 years of age, then have this test every 5 years. Have your cholesterol levels checked more often if:  Your lipid or cholesterol levels are high.  You are older than 22 years of age.  You are at high risk for heart disease. What should I know about cancer screening? Depending on your health history and family history, you may need to have cancer screening at various ages. This may include screening for:  Breast cancer.  Cervical cancer.  Colorectal cancer.  Skin cancer.  Lung cancer. What should I know about heart disease, diabetes, and high blood  pressure? Blood pressure and heart disease  High blood pressure causes heart disease and increases the risk of stroke. This is more likely to develop in people who have high blood pressure readings, are of African descent, or are overweight.  Have your blood pressure checked: ? Every 3-5 years if you are 18-39 years of age. ? Every year if you are 40 years old or older. Diabetes Have regular diabetes screenings. This checks your fasting blood sugar level. Have the screening done:  Once every three years after age 40 if you are at a normal weight and have a low risk for diabetes.  More often and at a younger age if you are overweight or have a high risk for diabetes. What should I know about preventing infection? Hepatitis B If you have a higher risk for hepatitis B, you should be screened for this virus. Talk with your health care provider to find out if you are at risk for hepatitis B infection. Hepatitis C Testing is recommended for:  Everyone born from 1945 through 1965.  Anyone with known risk factors for hepatitis C. Sexually transmitted infections (STIs)  Get screened for STIs, including gonorrhea and chlamydia, if: ? You are sexually active and are younger than 22 years of age. ? You are older than 22 years of age and your health care provider tells you that you are at risk for this type of infection. ? Your sexual activity has changed since you were last screened, and you are at increased risk for chlamydia or gonorrhea. Ask your health care provider if   you are at risk.  Ask your health care provider about whether you are at high risk for HIV. Your health care provider may recommend a prescription medicine to help prevent HIV infection. If you choose to take medicine to prevent HIV, you should first get tested for HIV. You should then be tested every 3 months for as long as you are taking the medicine. Pregnancy  If you are about to stop having your period (premenopausal) and  you may become pregnant, seek counseling before you get pregnant.  Take 400 to 800 micrograms (mcg) of folic acid every day if you become pregnant.  Ask for birth control (contraception) if you want to prevent pregnancy. Osteoporosis and menopause Osteoporosis is a disease in which the bones lose minerals and strength with aging. This can result in bone fractures. If you are 65 years old or older, or if you are at risk for osteoporosis and fractures, ask your health care provider if you should:  Be screened for bone loss.  Take a calcium or vitamin D supplement to lower your risk of fractures.  Be given hormone replacement therapy (HRT) to treat symptoms of menopause. Follow these instructions at home: Lifestyle  Do not use any products that contain nicotine or tobacco, such as cigarettes, e-cigarettes, and chewing tobacco. If you need help quitting, ask your health care provider.  Do not use street drugs.  Do not share needles.  Ask your health care provider for help if you need support or information about quitting drugs. Alcohol use  Do not drink alcohol if: ? Your health care provider tells you not to drink. ? You are pregnant, may be pregnant, or are planning to become pregnant.  If you drink alcohol: ? Limit how much you use to 0-1 drink a day. ? Limit intake if you are breastfeeding.  Be aware of how much alcohol is in your drink. In the [place] one drink equals one 12 oz bottle of beer (355 mL), one 5 oz glass of wine (148 mL), or one 1 oz glass of hard liquor (44 mL). General instructions  Schedule regular health, dental, and eye exams.  Stay current with your vaccines.  Tell your health care provider if: ? You often feel depressed. ? You have ever been abused or do not feel safe at home. Summary  Adopting a healthy lifestyle and getting preventive care are important in promoting health and wellness.  Follow your health care provider's instructions about healthy  diet, exercising, and getting tested or screened for diseases.  Follow your health care provider's instructions on monitoring your cholesterol and blood pressure. This information is not intended to replace advice given to you by your health care provider. Make sure you discuss any questions you have with your health care provider. Document Revised: 08/26/2018 Document Reviewed: 08/26/2018 Elsevier Patient Education  2020 [employer]  

## 2020-03-10 NOTE — Progress Notes (Signed)
Follow up.

## 2020-03-11 LAB — CBC WITH DIFFERENTIAL/PLATELET
Basophils Absolute: 0 10*3/uL (ref 0.0–0.2)
Basos: 0 %
EOS (ABSOLUTE): 0.2 10*3/uL (ref 0.0–0.4)
Eos: 3 %
Hematocrit: 43.3 % (ref 34.0–46.6)
Hemoglobin: 14.5 g/dL (ref 11.1–15.9)
Immature Grans (Abs): 0 10*3/uL (ref 0.0–0.1)
Immature Granulocytes: 0 %
Lymphocytes Absolute: 2.6 10*3/uL (ref 0.7–3.1)
Lymphs: 28 %
MCH: 30.5 pg (ref 26.6–33.0)
MCHC: 33.5 g/dL (ref 31.5–35.7)
MCV: 91 fL (ref 79–97)
Monocytes Absolute: 0.6 10*3/uL (ref 0.1–0.9)
Monocytes: 7 %
Neutrophils Absolute: 5.8 10*3/uL (ref 1.4–7.0)
Neutrophils: 62 %
Platelets: 315 10*3/uL (ref 150–450)
RBC: 4.75 x10E6/uL (ref 3.77–5.28)
RDW: 14.1 % (ref 11.7–15.4)
WBC: 9.3 10*3/uL (ref 3.4–10.8)

## 2020-03-11 LAB — CMP14+EGFR
ALT: 26 IU/L (ref 0–32)
AST: 18 IU/L (ref 0–40)
Albumin/Globulin Ratio: 1.3 (ref 1.2–2.2)
Albumin: 4.2 g/dL (ref 3.9–5.0)
Alkaline Phosphatase: 105 IU/L (ref 48–121)
BUN/Creatinine Ratio: 8 — ABNORMAL LOW (ref 9–23)
BUN: 7 mg/dL (ref 6–20)
Bilirubin Total: 0.3 mg/dL (ref 0.0–1.2)
CO2: 24 mmol/L (ref 20–29)
Calcium: 9.7 mg/dL (ref 8.7–10.2)
Chloride: 100 mmol/L (ref 96–106)
Creatinine, Ser: 0.84 mg/dL (ref 0.57–1.00)
GFR calc Af Amer: 115 mL/min/{1.73_m2} (ref >59)
GFR calc non Af Amer: 100 mL/min/{1.73_m2} (ref >59)
Globulin, Total: 3.2 g/dL (ref 1.5–4.5)
Glucose: 96 mg/dL (ref 65–99)
Potassium: 4.2 mmol/L (ref 3.5–5.2)
Sodium: 140 mmol/L (ref 134–144)
Total Protein: 7.4 g/dL (ref 6.0–8.5)

## 2020-03-11 LAB — LIPID PANEL
Chol/HDL Ratio: 6.9 ratio — ABNORMAL HIGH (ref 0.0–4.4)
Cholesterol, Total: 275 mg/dL — ABNORMAL HIGH (ref 100–199)
HDL: 40 mg/dL (ref >39)
LDL Chol Calc (NIH): 210 mg/dL — ABNORMAL HIGH (ref 0–99)
Triglycerides: 136 mg/dL (ref 0–149)
VLDL Cholesterol Cal: 25 mg/dL (ref 5–40)

## 2020-03-11 LAB — HEPATITIS C ANTIBODY: Hep C Virus Ab: <0.1 s/co ratio (ref 0.0–0.9)

## 2020-03-11 LAB — MICROALBUMIN, URINE: Microalbumin, Urine: 33.5 ug/mL

## 2020-04-06 DIAGNOSIS — F3341 Major depressive disorder, recurrent, in partial remission: Secondary | ICD-10-CM | POA: Diagnosis not present

## 2020-04-06 DIAGNOSIS — F900 Attention-deficit hyperactivity disorder, predominantly inattentive type: Secondary | ICD-10-CM | POA: Diagnosis not present

## 2020-05-09 ENCOUNTER — Other Ambulatory Visit: Payer: Self-pay

## 2020-05-09 ENCOUNTER — Encounter (HOSPITAL_COMMUNITY): Payer: Self-pay

## 2020-05-09 ENCOUNTER — Ambulatory Visit (HOSPITAL_COMMUNITY)
Admission: EM | Admit: 2020-05-09 | Discharge: 2020-05-09 | Disposition: A | Discharge status: Home / Self Care | Payer: Medicaid Other | Attending: Physician Assistant | Admitting: Physician Assistant

## 2020-05-09 DIAGNOSIS — L739 Follicular disorder, unspecified: Secondary | ICD-10-CM | POA: Diagnosis not present

## 2020-05-09 MED ORDER — DOXYCYCLINE HYCLATE 100 MG PO CAPS
100.0000 mg | ORAL_CAPSULE | Freq: Two times a day (BID) | ORAL | 0 refills | Status: AC
Start: 2020-05-09 — End: 2020-05-16

## 2020-05-09 NOTE — ED Provider Notes (Signed)
[facility]    CSN: [id number] Arrival date & time: 05/09/20  1735      History   Chief Complaint Chief Complaint  Patient presents with  . Abscess    HPI Elizabeth Howell is a 24 y.o. female.   Patient with history of hidradenitis suppurativa presents for concern of abscess in her right axilla.  She reports she has had bumps there for a long time but it become painful over the last few weeks.  They have not grown significantly.  There is been no discharge.  Denies fevers or chills.  They are not as big as they were in the left axilla when she had to have these removed surgically.     Past Medical History:  Diagnosis Date  . ADHD (attention deficit hyperactivity disorder)    no current med.  . Depression   . Hidradenitis suppurativa of left axilla 07/2017  . History of asthma    no current med.  . Hyperlipidemia   . Nocturia   . Pre-diabetes     Patient Active Problem List   Diagnosis Date Noted  . Anxiety and depression 01/14/2017  . Dyslipidemia 11/19/2016  . Morbid obesity ([facility]) 11/25/2014  . Insulin resistance 11/25/2014  . Hyperinsulinemia 11/25/2014  . Essential hypertension, benign 11/25/2014  . Acanthosis nigricans, acquired 11/25/2014  . Dyspepsia 11/25/2014  . Goiter 11/25/2014  . Thyroiditis, autoimmune 11/25/2014  . 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 Skin striae 11/25/2014  . Prediabetes 11/25/2014    Past Surgical History:  Procedure Laterality Date  . HYDRADENITIS EXCISION Left 07/30/2017   Procedure: EXCISION LEFT AXILLA HIDRADENITIS;  Surgeon: Erroll Luna, MD;  Location: [facility];  Service: General;  Laterality: Left;  . WISDOM TOOTH EXTRACTION      OB History   No obstetric history on file.      Home Medications    Prior to Admission medications   Medication Sig Start Date End Date Taking? Authorizing Provider  atorvastatin (LIPITOR) 40 MG tablet Take 1 tablet (40 mg total) by mouth daily.  03/10/20   Kerin Perna, NP  cholecalciferol (VITAMIN D) 1000 units tablet Take 1 tablet (1,000 Units total) by mouth daily. 07/20/18   Clent Demark, PA-C  doxycycline (VIBRAMYCIN) 100 MG capsule Take 1 capsule (100 mg total) by mouth 2 (two) times daily for 7 days. 05/09/20 05/16/20  Jamare Vanatta, Marguerita Beards, PA-C  metFORMIN (GLUCOPHAGE) 500 MG tablet Take 1 tablet (500 mg total) by mouth daily with breakfast. 03/10/20   Kerin Perna, NP  Omega-3 Fatty Acids (FISH OIL) 1000 MG CAPS Take 1 capsule (1,000 mg total) by mouth daily. 07/20/18   Clent Demark, PA-C    Family History Family History  Problem Relation Age of Onset  . Hypertension Maternal Grandfather   . Diabetes Other     Social History Social History   Tobacco Use  . Smoking status: Never Smoker  . Smokeless tobacco: Never Used  Vaping Use  . Vaping Use: Never used  Substance Use Topics  . Alcohol use: No    Alcohol/week: 0.0 standard drinks  . Drug use: No     Allergies   Patient has no known allergies.   Review of Systems Review of Systems   Physical Exam Triage Vital Signs ED Triage Vitals  Enc Vitals Group     BP 05/09/20 1806 99/80     Pulse Rate 05/09/20 1806 85     Resp 05/09/20 1806 18  Temp 05/09/20 1806 99 F (37.2 C)     Temp Source 05/09/20 1806 Oral     SpO2 05/09/20 1806 100 %     Weight --      Height --      Head Circumference --      Peak Flow --      Pain Score 05/09/20 1807 7     Pain Loc --      Pain Edu? --      Excl. in [place]? --    No data found.  Updated Vital Signs BP 99/80 (BP Location: Left Arm)   Pulse 85   Temp 99 F (37.2 C) (Oral)   Resp 18   LMP 05/01/2020   SpO2 100%   Visual Acuity Right Eye Distance:   Left Eye Distance:   Bilateral Distance:    Right Eye Near:   Left Eye Near:    Bilateral Near:     Physical Exam Vitals and nursing note reviewed.  Constitutional:      General: She is not in acute distress.    Appearance: Normal  appearance. She is not ill-appearing.  Cardiovascular:     Rate and Rhythm: Normal rate.     Heart sounds: No murmur heard.   Pulmonary:     Effort: Pulmonary effort is normal. No respiratory distress.  Skin:    Comments: 2 perhaps 3 tender joints under the skin in the right axilla.  No drainage, fluctuance or surrounding induration.  There is an axillary lymph node palpable.  Neurological:     Mental Status: She is alert.      UC Treatments / Results  Labs (all labs ordered are listed, but only abnormal results are displayed) Labs Reviewed - No data to display  EKG   Radiology No results found.  Procedures Procedures (including critical care time)  Medications Ordered in UC Medications - No data to display  Initial Impression / Assessment and Plan / UC Course  I have reviewed the triage vital signs and the nursing notes.  Pertinent labs & imaging results that were available during my care of the patient were reviewed by me and considered in my medical decision making (see chart for details).     #Folliculitis of right axilla Patient is a 24 year old presenting with tender folliculitis of the right axilla.  Given history of hidranitis likely some aspect of this at play.  No obvious drainable abscess today.  Will place on doxycycline.  Follow-up and return precautions discussed.  Patient verbalized understanding plan of care. -Patient just finished her menstrual period states no chance of pregnancy.  Cannot be pregnant. Final Clinical Impressions(s) / UC Diagnoses   Final diagnoses:  Folliculitis     Discharge Instructions     Take the medication as prescribed  Follow up with your primary care if not improving, if significantly worsening in the next few days, return    ED Prescriptions    Medication Sig Dispense Auth. Provider   doxycycline (VIBRAMYCIN) 100 MG capsule Take 1 capsule (100 mg total) by mouth 2 (two) times daily for 7 days. 14 capsule Azriel Dancy,  Marguerita Beards, PA-C     PDMP not reviewed this encounter.   Purnell Shoemaker, PA-C 05/09/20 1835

## 2020-05-09 NOTE — ED Triage Notes (Signed)
Pt presents with abscess under right underarm X 2 weeks.

## 2020-05-09 NOTE — Discharge Instructions (Signed)
Take the medication as prescribed  Follow up with your primary care if not improving, if significantly worsening in the next few days, return

## 2020-05-15 DIAGNOSIS — H0288A Meibomian gland dysfunction right eye, upper and lower eyelids: Secondary | ICD-10-CM | POA: Diagnosis not present

## 2020-05-15 DIAGNOSIS — H0288B Meibomian gland dysfunction left eye, upper and lower eyelids: Secondary | ICD-10-CM | POA: Diagnosis not present

## 2020-05-15 DIAGNOSIS — H40013 Open angle with borderline findings, low risk, bilateral: Secondary | ICD-10-CM | POA: Diagnosis not present

## 2020-05-15 DIAGNOSIS — H04123 Dry eye syndrome of bilateral lacrimal glands: Secondary | ICD-10-CM | POA: Diagnosis not present

## 2020-05-18 ENCOUNTER — Ambulatory Visit: Payer: Medicaid Other | Attending: Family Medicine | Admitting: Family Medicine

## 2020-05-18 ENCOUNTER — Other Ambulatory Visit (HOSPITAL_COMMUNITY)
Admission: RE | Admit: 2020-05-18 | Discharge: 2020-05-18 | Disposition: A | Discharge status: Home / Self Care | Payer: Medicaid Other | Source: Ambulatory Visit | Attending: Family Medicine | Admitting: Family Medicine

## 2020-05-18 ENCOUNTER — Encounter: Payer: Self-pay | Admitting: Family Medicine

## 2020-05-18 ENCOUNTER — Other Ambulatory Visit: Payer: Self-pay

## 2020-05-18 VITALS — BP 119/64 | HR 105 | Ht 63.5 in | Wt 272.0 lb

## 2020-05-18 DIAGNOSIS — Z113 Encounter for screening for infections with a predominantly sexual mode of transmission: Secondary | ICD-10-CM | POA: Diagnosis not present

## 2020-05-18 DIAGNOSIS — Z0000 Encounter for general adult medical examination without abnormal findings: Secondary | ICD-10-CM

## 2020-05-18 DIAGNOSIS — Z124 Encounter for screening for malignant neoplasm of cervix: Secondary | ICD-10-CM

## 2020-05-18 DIAGNOSIS — Z23 Encounter for immunization: Secondary | ICD-10-CM

## 2020-05-18 NOTE — Patient Instructions (Signed)
Health Maintenance, Female Adopting a healthy lifestyle and getting preventive care are important in promoting health and wellness. Ask your health care provider about:  The right schedule for you to have regular tests and exams.  Things you can do on your own to prevent diseases and keep yourself healthy. What should I know about diet, weight, and exercise? Eat a healthy diet   Eat a diet that includes plenty of vegetables, fruits, low-fat dairy products, and lean protein.  Do not eat a lot of foods that are high in solid fats, added sugars, or sodium. Maintain a healthy weight Body mass index (BMI) is used to identify weight problems. It estimates body fat based on height and weight. Your health care provider can help determine your BMI and help you achieve or maintain a healthy weight. Get regular exercise Get regular exercise. This is one of the most important things you can do for your health. Most adults should:  Exercise for at least 150 minutes each week. The exercise should increase your heart rate and make you sweat (moderate-intensity exercise).  Do strengthening exercises at least twice a week. This is in addition to the moderate-intensity exercise.  Spend less time sitting. Even light physical activity can be beneficial. Watch cholesterol and blood lipids Have your blood tested for lipids and cholesterol at 22 years of age, then have this test every 5 years. Have your cholesterol levels checked more often if:  Your lipid or cholesterol levels are high.  You are older than 22 years of age.  You are at high risk for heart disease. What should I know about cancer screening? Depending on your health history and family history, you may need to have cancer screening at various ages. This may include screening for:  Breast cancer.  Cervical cancer.  Colorectal cancer.  Skin cancer.  Lung cancer. What should I know about heart disease, diabetes, and high blood  pressure? Blood pressure and heart disease  High blood pressure causes heart disease and increases the risk of stroke. This is more likely to develop in people who have high blood pressure readings, are of African descent, or are overweight.  Have your blood pressure checked: ? Every 3-5 years if you are 18-39 years of age. ? Every year if you are 40 years old or older. Diabetes Have regular diabetes screenings. This checks your fasting blood sugar level. Have the screening done:  Once every three years after age 40 if you are at a normal weight and have a low risk for diabetes.  More often and at a younger age if you are overweight or have a high risk for diabetes. What should I know about preventing infection? Hepatitis B If you have a higher risk for hepatitis B, you should be screened for this virus. Talk with your health care provider to find out if you are at risk for hepatitis B infection. Hepatitis C Testing is recommended for:  Everyone born from 1945 through 1965.  Anyone with known risk factors for hepatitis C. Sexually transmitted infections (STIs)  Get screened for STIs, including gonorrhea and chlamydia, if: ? You are sexually active and are younger than 22 years of age. ? You are older than 22 years of age and your health care provider tells you that you are at risk for this type of infection. ? Your sexual activity has changed since you were last screened, and you are at increased risk for chlamydia or gonorrhea. Ask your health care provider if   you are at risk.  Ask your health care provider about whether you are at high risk for HIV. Your health care provider may recommend a prescription medicine to help prevent HIV infection. If you choose to take medicine to prevent HIV, you should first get tested for HIV. You should then be tested every 3 months for as long as you are taking the medicine. Pregnancy  If you are about to stop having your period (premenopausal) and  you may become pregnant, seek counseling before you get pregnant.  Take 400 to 800 micrograms (mcg) of folic acid every day if you become pregnant.  Ask for birth control (contraception) if you want to prevent pregnancy. Osteoporosis and menopause Osteoporosis is a disease in which the bones lose minerals and strength with aging. This can result in bone fractures. If you are 65 years old or older, or if you are at risk for osteoporosis and fractures, ask your health care provider if you should:  Be screened for bone loss.  Take a calcium or vitamin D supplement to lower your risk of fractures.  Be given hormone replacement therapy (HRT) to treat symptoms of menopause. Follow these instructions at home: Lifestyle  Do not use any products that contain nicotine or tobacco, such as cigarettes, e-cigarettes, and chewing tobacco. If you need help quitting, ask your health care provider.  Do not use street drugs.  Do not share needles.  Ask your health care provider for help if you need support or information about quitting drugs. Alcohol use  Do not drink alcohol if: ? Your health care provider tells you not to drink. ? You are pregnant, may be pregnant, or are planning to become pregnant.  If you drink alcohol: ? Limit how much you use to 0-1 drink a day. ? Limit intake if you are breastfeeding.  Be aware of how much alcohol is in your drink. In the [place] one drink equals one 12 oz bottle of beer (355 mL), one 5 oz glass of wine (148 mL), or one 1 oz glass of hard liquor (44 mL). General instructions  Schedule regular health, dental, and eye exams.  Stay current with your vaccines.  Tell your health care provider if: ? You often feel depressed. ? You have ever been abused or do not feel safe at home. Summary  Adopting a healthy lifestyle and getting preventive care are important in promoting health and wellness.  Follow your health care provider's instructions about healthy  diet, exercising, and getting tested or screened for diseases.  Follow your health care provider's instructions on monitoring your cholesterol and blood pressure. This information is not intended to replace advice given to you by your health care provider. Make sure you discuss any questions you have with your health care provider. Document Revised: 08/26/2018 Document Reviewed: 08/26/2018 Elsevier Patient Education  2020 [employer]  

## 2020-05-18 NOTE — Progress Notes (Signed)
Patient has no concerns today.  Patient will be getting physical and pap smear.

## 2020-05-18 NOTE — Progress Notes (Signed)
Subjective:  Patient ID: Elizabeth Howell, female    DOB: November 24, 1995  Age: 24 y.o. MRN: [id number]  CC: New Patient (Initial Visit)   HPI Theressa Piedra presents for an annual physical exam. She was originally a patient of Juluis Mire, NP. Never had a Pap smear and has never been sexually active.  Labs from 02/2020 revealed an A1c of 5.7 with prediabetes and elevated lipid panel. She does not exercise regularly and does not make active effort to eat a healthy diet. She has no additional concerns today.  Past Medical History:  Diagnosis Date  . ADHD (attention deficit hyperactivity disorder)    no current med.  . Depression   . Hidradenitis suppurativa of left axilla 07/2017  . History of asthma    no current med.  . Hyperlipidemia   . Nocturia   . Pre-diabetes     Past Surgical History:  Procedure Laterality Date  . HYDRADENITIS EXCISION Left 07/30/2017   Procedure: EXCISION LEFT AXILLA HIDRADENITIS;  Surgeon: Erroll Luna, MD;  Location: [facility];  Service: General;  Laterality: Left;  . WISDOM TOOTH EXTRACTION      Family History  Problem Relation Age of Onset  . Hypertension Maternal Grandfather   . Diabetes Other     No Known Allergies  Outpatient Medications Prior to Visit  Medication Sig Dispense Refill  . atorvastatin (LIPITOR) 40 MG tablet Take 1 tablet (40 mg total) by mouth daily. 90 tablet 3  . cholecalciferol (VITAMIN D) 1000 units tablet Take 1 tablet (1,000 Units total) by mouth daily. 90 tablet 3  . metFORMIN (GLUCOPHAGE) 500 MG tablet Take 1 tablet (500 mg total) by mouth daily with breakfast. 90 tablet 1  . Omega-3 Fatty Acids (FISH OIL) 1000 MG CAPS Take 1 capsule (1,000 mg total) by mouth daily. 30 capsule 11   No facility-administered medications prior to visit.     ROS Review of Systems  Constitutional: Negative for activity change, appetite change and fatigue.  HENT: Negative for congestion, sinus pressure  and sore throat.   Eyes: Negative for visual disturbance.  Respiratory: Negative for cough, chest tightness, shortness of breath and wheezing.   Cardiovascular: Negative for chest pain and palpitations.  Gastrointestinal: Negative for abdominal distention, abdominal pain and constipation.  Endocrine: Negative for polydipsia.  Genitourinary: Negative for dysuria and frequency.  Musculoskeletal: Negative for arthralgias and back pain.  Skin: Negative for rash.  Neurological: Negative for tremors, light-headedness and numbness.  Hematological: Does not bruise/bleed easily.  Psychiatric/Behavioral: Negative for agitation and behavioral problems.    Objective:  BP 119/64   Pulse (!) 105   Ht 5' 3.5" (1.613 m)   Wt 272 lb (123.4 kg)   LMP 05/01/2020   SpO2 97%   BMI 47.43 kg/m   BP/Weight 05/18/2020 05/09/2020 3/41/9379  Systolic BP 024 99 097  Diastolic BP 64 80 75  Wt. (Lbs) 272 - 261  BMI 47.43 - 45.51      Physical Exam Constitutional:      General: She is not in acute distress.    Appearance: She is well-developed. She is not diaphoretic.  HENT:     Head: Normocephalic.     Right Ear: External ear normal.     Left Ear: External ear normal.     Nose: Nose normal.  Eyes:     Conjunctiva/sclera: Conjunctivae normal.     Pupils: Pupils are equal, round, and reactive to light.  Neck:     Vascular: No  JVD.  Cardiovascular:     Rate and Rhythm: Normal rate and regular rhythm.     Heart sounds: Normal heart sounds. No murmur heard.  No gallop.   Pulmonary:     Effort: Pulmonary effort is normal. No respiratory distress.     Breath sounds: Normal breath sounds. No wheezing or rales.  Chest:     Chest wall: No tenderness.  Abdominal:     General: Bowel sounds are normal. There is no distension.     Palpations: Abdomen is soft. There is no mass.     Tenderness: There is no abdominal tenderness.  Genitourinary:    Comments: External genitalia, vagina,  cervix-normal Musculoskeletal:        General: No tenderness. Normal range of motion.     Cervical back: Normal range of motion.  Skin:    General: Skin is warm and dry.  Neurological:     Mental Status: She is alert and oriented to person, place, and time.     Deep Tendon Reflexes: Reflexes are normal and symmetric.     CMP Latest Ref Rng & Units 03/10/2020 08/04/2019 07/20/2018  Glucose 65 - 99 mg/dL 96 97 104(H)  BUN 6 - 20 mg/dL 7 7 12   Creatinine 0.57 - 1.00 mg/dL 0.84 0.73 0.66  Sodium 134 - 144 mmol/L 140 141 141  Potassium 3.5 - 5.2 mmol/L 4.2 4.3 4.1  Chloride 96 - 106 mmol/L 100 101 103  CO2 20 - 29 mmol/L 24 23 22   Calcium 8.7 - 10.2 mg/dL 9.7 9.7 9.7  Total Protein 6.0 - 8.5 g/dL 7.4 7.6 7.1  Total Bilirubin 0.0 - 1.2 mg/dL 0.3 0.4 <0.2  Alkaline Phos 48 - 121 IU/L 105 127(H) 116  AST 0 - 40 IU/L 18 21 20   ALT 0 - 32 IU/L 26 23 22     Lipid Panel     Component Value Date/Time   CHOL 275 (H) 03/10/2020 1120   TRIG 136 03/10/2020 1120   HDL 40 03/10/2020 1120   CHOLHDL 6.9 (H) 03/10/2020 1120   LDLCALC 210 (H) 03/10/2020 1120    CBC    Component Value Date/Time   WBC 9.3 03/10/2020 1120   WBC 5.9 11/06/2007 0645   RBC 4.75 03/10/2020 1120   RBC 4.46 11/06/2007 0645   HGB 14.5 03/10/2020 1120   HCT 43.3 03/10/2020 1120   PLT 315 03/10/2020 1120   MCV 91 03/10/2020 1120   MCH 30.5 03/10/2020 1120   MCHC 33.5 03/10/2020 1120   MCHC 34.9 11/06/2007 0645   RDW 14.1 03/10/2020 1120   LYMPHSABS 2.6 03/10/2020 1120   MONOABS 0.4 11/06/2007 0645   EOSABS 0.2 03/10/2020 1120   BASOSABS 0.0 03/10/2020 1120    Lab Results  Component Value Date   HGBA1C 5.7 (A) 03/10/2020    Assessment & Plan:  1. Annual physical exam Counseled on 150 minutes of exercise per week, healthy eating (including decreased daily intake of saturated fats, cholesterol, added sugars, sodium), STI prevention, routine healthcare maintenance. - Lipid panel  2. Screening for STD  (sexually transmitted disease) - Cervicovaginal ancillary only  3. Screening for cervical cancer - Cytology - PAP([place])    No orders of the defined types were placed in this encounter.   Follow-up: Return in about 1 year (around 05/18/2021) for annual physical exam.       Charlott Rakes, MD, FAAFP. [facility], [place]   05/18/2020, 10:01 AM

## 2020-05-19 ENCOUNTER — Telehealth: Payer: Self-pay

## 2020-05-19 ENCOUNTER — Other Ambulatory Visit: Payer: Self-pay | Admitting: Family Medicine

## 2020-05-19 LAB — CERVICOVAGINAL ANCILLARY ONLY
Bacterial Vaginitis (gardnerella): NEGATIVE
Candida Glabrata: NEGATIVE
Candida Vaginitis: NEGATIVE
Chlamydia: NEGATIVE
Comment: NEGATIVE
Comment: NEGATIVE
Comment: NEGATIVE
Comment: NEGATIVE
Comment: NEGATIVE
Comment: NORMAL
Neisseria Gonorrhea: NEGATIVE
Trichomonas: NEGATIVE

## 2020-05-19 LAB — LIPID PANEL
Chol/HDL Ratio: 6.5 ratio — ABNORMAL HIGH (ref 0.0–4.4)
Cholesterol, Total: 275 mg/dL — ABNORMAL HIGH (ref 100–199)
HDL: 42 mg/dL (ref >39)
LDL Chol Calc (NIH): 204 mg/dL — ABNORMAL HIGH (ref 0–99)
Triglycerides: 155 mg/dL — ABNORMAL HIGH (ref 0–149)
VLDL Cholesterol Cal: 29 mg/dL (ref 5–40)

## 2020-05-19 NOTE — Telephone Encounter (Signed)
Patient was called and informed of lab results via voicemail. 

## 2020-05-19 NOTE — Telephone Encounter (Signed)
-----   Message from Charlott Rakes, MD sent at 05/19/2020  9:21 AM EDT ----- Labs reveal elevated cholesterol level. Please advise to comply with Atorvastatin that was prescribed by Juluis Mire in 02/2020

## 2020-05-23 LAB — CYTOLOGY - PAP: Diagnosis: NEGATIVE

## 2020-05-24 DIAGNOSIS — F909 Attention-deficit hyperactivity disorder, unspecified type: Secondary | ICD-10-CM | POA: Diagnosis not present

## 2020-05-24 DIAGNOSIS — F3341 Major depressive disorder, recurrent, in partial remission: Secondary | ICD-10-CM | POA: Diagnosis not present

## 2020-06-02 ENCOUNTER — Other Ambulatory Visit: Payer: Self-pay

## 2020-06-02 ENCOUNTER — Ambulatory Visit (HOSPITAL_COMMUNITY)
Admission: EM | Admit: 2020-06-02 | Discharge: 2020-06-02 | Disposition: A | Discharge status: Home / Self Care | Payer: Medicaid Other | Attending: Emergency Medicine | Admitting: Emergency Medicine

## 2020-06-02 ENCOUNTER — Encounter (HOSPITAL_COMMUNITY): Payer: Self-pay

## 2020-06-02 DIAGNOSIS — L02411 Cutaneous abscess of right axilla: Secondary | ICD-10-CM

## 2020-06-02 MED ORDER — LIDOCAINE-EPINEPHRINE 1 %-1:100000 IJ SOLN
INTRAMUSCULAR | Status: AC
  Filled 2020-06-02: qty 1

## 2020-06-02 NOTE — ED Provider Notes (Addendum)
[place]    CSN: [id number] Arrival date & time: 06/02/20  1911      History   Chief Complaint Chief Complaint  Patient presents with  . Abscess    HPI Elizabeth Howell is a 24 y.o. female.   24 yo female here for evaluation of an abscess in both axilla, right sided for over 1 months and left sided x 1 week. She reports the left sided abscess ruptured on its own last week.      Past Medical History:  Diagnosis Date  . ADHD (attention deficit hyperactivity disorder)    no current med.  . Depression   . Hidradenitis suppurativa of left axilla 07/2017  . History of asthma    no current med.  . Hyperlipidemia   . Nocturia   . Pre-diabetes     Patient Active Problem List   Diagnosis Date Noted  . Anxiety and depression 01/14/2017  . Dyslipidemia 11/19/2016  . Morbid obesity ([place]) 11/25/2014  . Insulin resistance 11/25/2014  . Hyperinsulinemia 11/25/2014  . Essential hypertension, benign 11/25/2014  . Acanthosis nigricans, acquired 11/25/2014  . Dyspepsia 11/25/2014  . Goiter 11/25/2014  . Thyroiditis, autoimmune 11/25/2014  . 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 Skin striae 11/25/2014  . Prediabetes 11/25/2014    Past Surgical History:  Procedure Laterality Date  . HYDRADENITIS EXCISION Left 07/30/2017   Procedure: EXCISION LEFT AXILLA HIDRADENITIS;  Surgeon: Erroll Luna, MD;  Location: [facility];  Service: General;  Laterality: Left;  . WISDOM TOOTH EXTRACTION      OB History   No obstetric history on file.      Home Medications    Prior to Admission medications   Medication Sig Start Date End Date Taking? Authorizing Provider  atorvastatin (LIPITOR) 40 MG tablet Take 1 tablet (40 mg total) by mouth daily. 03/10/20   Kerin Perna, NP  cholecalciferol (VITAMIN D) 1000 units tablet Take 1 tablet (1,000 Units total) by mouth daily. 07/20/18   Clent Demark, PA-C  metFORMIN (GLUCOPHAGE) 500 MG tablet Take  1 tablet (500 mg total) by mouth daily with breakfast. 03/10/20   Kerin Perna, NP  Omega-3 Fatty Acids (FISH OIL) 1000 MG CAPS Take 1 capsule (1,000 mg total) by mouth daily. 07/20/18   Clent Demark, PA-C    Family History Family History  Problem Relation Age of Onset  . Hypertension Maternal Grandfather   . Diabetes Other     Social History Social History   Tobacco Use  . Smoking status: Never Smoker  . Smokeless tobacco: Never Used  Vaping Use  . Vaping Use: Never used  Substance Use Topics  . Alcohol use: No    Alcohol/week: 0.0 standard drinks  . Drug use: No     Allergies   Patient has no known allergies.   Review of Systems Review of Systems  Constitutional: Negative for activity change, appetite change, chills and fever.  HENT: Negative for congestion and rhinorrhea.   Respiratory: Negative for cough, chest tightness and shortness of breath.   Cardiovascular: Negative for chest pain.  Gastrointestinal: Negative for diarrhea, nausea and vomiting.  Musculoskeletal: Negative for arthralgias and joint swelling.  Skin:       Swelling in both arm pits.      Physical Exam Triage Vital Signs ED Triage Vitals  Enc Vitals Group     BP 06/02/20 2012 101/71     Pulse Rate 06/02/20 2012 100     Resp 06/02/20  2012 18     Temp 06/02/20 2012 99.9 F (37.7 C)     Temp Source 06/02/20 2012 Oral     SpO2 06/02/20 2012 99 %     Weight 06/02/20 2011 272 lb (123.4 kg)     Height 06/02/20 2011 5\' 2"  (1.575 m)     Head Circumference --      Peak Flow --      Pain Score 06/02/20 2011 5     Pain Loc --      Pain Edu? --      Excl. in [place]? --    No data found.  Updated Vital Signs BP 101/71   Pulse 100   Temp 99.9 F (37.7 C) (Oral)   Resp 18   Ht 5\' 2"  (1.575 m)   Wt 272 lb (123.4 kg)   SpO2 99%   BMI 49.75 kg/m   Visual Acuity Right Eye Distance:   Left Eye Distance:   Bilateral Distance:    Right Eye Near:   Left Eye Near:    Bilateral  Near:     Physical Exam Vitals and nursing note reviewed.  Constitutional:      Appearance: Normal appearance. She is obese.  HENT:     Head: Normocephalic and atraumatic.     Nose: Nose normal.     Mouth/Throat:     Mouth: Mucous membranes are moist.     Pharynx: Oropharynx is clear.  Eyes:     Extraocular Movements: Extraocular movements intact.     Conjunctiva/sclera: Conjunctivae normal.     Pupils: Pupils are equal, round, and reactive to light.  Cardiovascular:     Rate and Rhythm: Normal rate and regular rhythm.     Pulses: Normal pulses.     Heart sounds: Normal heart sounds.  Pulmonary:     Effort: Pulmonary effort is normal.     Breath sounds: Normal breath sounds.  Musculoskeletal:        General: Normal range of motion.     Cervical back: Normal range of motion and neck supple.  Skin:    General: Skin is warm and dry.     Capillary Refill: Capillary refill takes less than 2 seconds.     Comments: There is a 2 cm X 3 cm area of fluctuance in the right anterior axilla. There is no induration or redness of the overlaying tissue.   The left axilla is negative for redness, swelling, or induration.   Neurological:     General: No focal deficit present.     Mental Status: She is alert and oriented to person, place, and time. Mental status is at baseline.  Psychiatric:        Mood and Affect: Mood normal.        Behavior: Behavior normal.        Thought Content: Thought content normal.        Judgment: Judgment normal.      UC Treatments / Results  Labs (all labs ordered are listed, but only abnormal results are displayed) Labs Reviewed - No data to display  EKG   Radiology No results found.  Procedures Incision and Drainage  Date/Time: 06/02/2020 9:30 PM Performed by: Margarette Canada, NP Authorized by: Margarette Canada, NP   Consent:    Consent obtained:  Verbal   Consent given by:  Patient   Risks discussed:  Bleeding, incomplete drainage, pain and  infection   Alternatives discussed:  Alternative treatment Location:  Type:  Abscess   Size:  2 cm X 3 cm   Location:  Upper extremity   Upper extremity location: right axilla. Pre-procedure details:    Skin preparation:  Betadine Anesthesia (see MAR for exact dosages):    Anesthesia method:  Local infiltration   Local anesthetic:  Lidocaine 1% WITH epi (3 mL) Procedure type:    Complexity:  Simple Procedure details:    Needle aspiration: no     Incision types:  Stab incision   Incision depth:  Dermal   Scalpel blade:  11   Drainage:  Serosanguinous   Drainage amount:  Moderate   Wound treatment:  Wound left open   Packing materials:  None Post-procedure details:    Patient tolerance of procedure:  Tolerated well, no immediate complications Comments:     Pt tolerated procedure well. Guaze dressing secured with tape.    (including critical care time)  Medications Ordered in UC Medications - No data to display  Initial Impression / Assessment and Plan / UC Course  I have reviewed the triage vital signs and the nursing notes.  Pertinent labs & imaging results that were available during my care of the patient were reviewed by me and considered in my medical decision making (see chart for details).   Patient is here for drainage of an abscess in her right axilla that has been there for 1 month. Offered option of applying warm compresses until it ruptures or draining it tonight and patient opted for draining it.     Final Clinical Impressions(s) / UC Diagnoses   Final diagnoses:  Abscess of axilla, right     Discharge Instructions     Leave the dressing in place until tomorrow morning.  Afterwards remove the dressing and shower as usual.  Use OTC Tylenol and Ibuprofen as needed for pain.  Return for worsening symptoms.     ED Prescriptions    None     PDMP not reviewed this encounter.   Margarette Canada, NP 06/02/20 2137    Margarette Canada, NP 06/02/20  2137

## 2020-06-02 NOTE — Discharge Instructions (Addendum)
Leave the dressing in place until tomorrow morning.  Afterwards remove the dressing and shower as usual.  Use OTC Tylenol and Ibuprofen as needed for pain.  Return for worsening symptoms.

## 2020-06-02 NOTE — ED Triage Notes (Signed)
Pt states she has an abscess in her right axillax1mo. Pt states she has an abscess in the left axillax1 wk. Pt states the abscess in her left axilla burst last night.

## 2020-07-11 DIAGNOSIS — F3341 Major depressive disorder, recurrent, in partial remission: Secondary | ICD-10-CM | POA: Diagnosis not present

## 2020-07-11 DIAGNOSIS — F900 Attention-deficit hyperactivity disorder, predominantly inattentive type: Secondary | ICD-10-CM | POA: Diagnosis not present

## 2020-07-11 DIAGNOSIS — F329 Major depressive disorder, single episode, unspecified: Secondary | ICD-10-CM | POA: Diagnosis not present

## 2020-07-31 ENCOUNTER — Ambulatory Visit (HOSPITAL_COMMUNITY)
Admission: EM | Admit: 2020-07-31 | Discharge: 2020-07-31 | Disposition: A | Discharge status: Home / Self Care | Payer: Medicaid Other

## 2020-07-31 ENCOUNTER — Other Ambulatory Visit: Payer: Self-pay

## 2020-07-31 ENCOUNTER — Encounter (HOSPITAL_COMMUNITY): Payer: Self-pay

## 2020-07-31 DIAGNOSIS — M79621 Pain in right upper arm: Secondary | ICD-10-CM

## 2020-07-31 DIAGNOSIS — L02411 Cutaneous abscess of right axilla: Secondary | ICD-10-CM | POA: Diagnosis not present

## 2020-07-31 MED ORDER — NAPROXEN 500 MG PO TABS: 500.0000 mg | ORAL_TABLET | Freq: Two times a day (BID) | ORAL | 0 refills | Status: DC

## 2020-07-31 MED ORDER — SULFAMETHOXAZOLE-TRIMETHOPRIM 800-160 MG PO TABS: 1.0000 | ORAL_TABLET | Freq: Two times a day (BID) | ORAL | 0 refills | Status: DC

## 2020-07-31 NOTE — Discharge Instructions (Signed)
Please change your dressing 2-3 times daily. Do not apply any ointments or creams. Each time you change your dressing, make sure you clean gently around the perimeter of the wound with gentle soap and warm water. Pat your wound dry and let it air out if possible for 1-2 hours before reapplying another dressing.  °

## 2020-07-31 NOTE — ED Provider Notes (Signed)
[place]   MRN: [id number] DOB: Aug 20, 1996  Subjective:   Elizabeth Howell is a 24 y.o. female presenting for recurrent axillary pain, drainage. Has had this problem since August, has undergone 2 rounds of antibiotics and an incision and drainage at her last visit. Symptoms are recurring now in the past week. Has not had to see dermatologist before.  No current facility-administered medications for this encounter.  Current Outpatient Medications:  .  cholecalciferol (VITAMIN D) 1000 units tablet, Take 1 tablet (1,000 Units total) by mouth daily., Disp: 90 tablet, Rfl: 3 .  metFORMIN (GLUCOPHAGE) 500 MG tablet, Take 1 tablet (500 mg total) by mouth daily with breakfast., Disp: 90 tablet, Rfl: 1 .  Omega-3 Fatty Acids (FISH OIL) 1000 MG CAPS, Take 1 capsule (1,000 mg total) by mouth daily., Disp: 30 capsule, Rfl: 11 .  atorvastatin (LIPITOR) 40 MG tablet, Take 1 tablet (40 mg total) by mouth daily., Disp: 90 tablet, Rfl: 3 .  sertraline (ZOLOFT) 100 MG tablet, Take 100 mg by mouth daily., Disp: , Rfl:    No Known Allergies  Past Medical History:  Diagnosis Date  . ADHD (attention deficit hyperactivity disorder)    no current med.  . Depression   . Hidradenitis suppurativa of left axilla 07/2017  . History of asthma    no current med.  . Hyperlipidemia   . Nocturia   . Pre-diabetes      Past Surgical History:  Procedure Laterality Date  . HYDRADENITIS EXCISION Left 07/30/2017   Procedure: EXCISION LEFT AXILLA HIDRADENITIS;  Surgeon: Erroll Luna, MD;  Location: [facility];  Service: General;  Laterality: Left;  . WISDOM TOOTH EXTRACTION      Family History  Problem Relation Age of Onset  . Hypertension Maternal Grandfather   . Diabetes Other     Social History   Tobacco Use  . Smoking status: Never Smoker  . Smokeless tobacco: Never Used  Vaping Use  . Vaping Use: Never used  Substance Use Topics  . Alcohol use: No     Alcohol/week: 0.0 standard drinks  . Drug use: No    ROS   Objective:   Vitals: BP 123/88 (BP Location: Left Arm)   Pulse (!) 104   Temp 99 F (37.2 C) (Oral)   Resp 20   LMP 07/05/2020 (Approximate)   SpO2 98%   Physical Exam Constitutional:      General: She is not in acute distress.    Appearance: Normal appearance. She is well-developed. She is obese. She is not ill-appearing.  HENT:     Head: Normocephalic and atraumatic.     Nose: Nose normal.     Mouth/Throat:     Mouth: Mucous membranes are moist.     Pharynx: Oropharynx is clear.  Eyes:     General: No scleral icterus.    Extraocular Movements: Extraocular movements intact.     Pupils: Pupils are equal, round, and reactive to light.  Cardiovascular:     Rate and Rhythm: Normal rate.  Pulmonary:     Effort: Pulmonary effort is normal.  Skin:    General: Skin is warm and dry.       Neurological:     General: No focal deficit present.     Mental Status: She is alert and oriented to person, place, and time.  Psychiatric:        Mood and Affect: Mood normal.        Behavior: Behavior  normal.         Assessment and Plan :   PDMP not reviewed this encounter.  1. Abscess of axilla, right   2. Pain in right axilla     Suspect recurrent cyst, infected cyst/abscess, hidaradenitis. Start Bactrim, discussed wound care. Referral to dermatology is pending. Counseled patient on potential for adverse effects with medications prescribed/recommended today, ER and return-to-clinic precautions discussed, patient verbalized understanding.    Jaynee Eagles, [place] 07/31/20 1913

## 2020-07-31 NOTE — ED Triage Notes (Addendum)
Pt in with c/o abscess under right axilla that burst 1 week ago. States that it has been open for a few days but it is still draining fluid that has an odor.   states that has been cleaning and keeping wound dry  Open wound noted, no active bleeding or drainage

## 2020-08-02 DIAGNOSIS — F329 Major depressive disorder, single episode, unspecified: Secondary | ICD-10-CM | POA: Diagnosis not present

## 2020-08-16 DIAGNOSIS — F329 Major depressive disorder, single episode, unspecified: Secondary | ICD-10-CM | POA: Diagnosis not present

## 2020-08-31 ENCOUNTER — Other Ambulatory Visit: Payer: Self-pay

## 2020-08-31 ENCOUNTER — Ambulatory Visit (INDEPENDENT_AMBULATORY_CARE_PROVIDER_SITE_OTHER): Payer: Medicaid Other | Admitting: Family Medicine

## 2020-08-31 VITALS — BP 100/70 | HR 107 | Ht 62.0 in | Wt 271.6 lb

## 2020-08-31 DIAGNOSIS — L732 Hidradenitis suppurativa: Secondary | ICD-10-CM

## 2020-08-31 NOTE — Patient Instructions (Signed)
It was great seeing you today! You came if for concern of a cyst under your arm. We recommend that you discuss this with surgery to potentially remove area like you did on your other arm to prevent from re occurring. You can apply vaseline twice daily to prevent it sticking to your skin. Please give [place] a call if you have any further concerns.  [phone]. Take care!

## 2020-09-01 NOTE — Progress Notes (Addendum)
° ° °  SUBJECTIVE:   CHIEF COMPLAINT / HPI:   Ms. Bran presents to derm clinic for concern of a cyst under her right arm pit. She states it appeared a few months ago and drained on its own about a month ago. Denies pain but states it irritates her and sticks to her skin. Reports having surgery for cysts under her left arm pit. Denies fever or concern for infection.   PERTINENT  PMH / PSH:  Obesity  OBJECTIVE:   BP 100/70    Pulse (!) 107    Ht 5\' 2"  (1.575 m)    Wt 271 lb 9.6 oz (123.2 kg)    LMP 08/02/2020    SpO2 98%    BMI 49.68 kg/m    Physical exam  General: well appearing, NAD Cardiovascular: RRR, no murmurs Lungs: CTAB. Normal WOB Abdomen: soft, non-distended, non-tender Skin: warm, dry. Opened cyst under R axilla with possible sinus tract formation. Without warmth, erythema or drainage      ASSESSMENT/PLAN:   No problem-specific Assessment & Plan notes found for this encounter.   Hydradenitis Suppurativa Patient with reoccurring cysts under her arms - f/u with surgery as she likely has a persistent tract that will need wide surgical removal  - apply vaseline to affected area twice a day to prevent sticking to skin    Shary Key, [place]

## 2020-09-13 ENCOUNTER — Telehealth: Payer: Self-pay | Admitting: Primary Care

## 2020-09-13 DIAGNOSIS — L732 Hidradenitis suppurativa: Secondary | ICD-10-CM

## 2020-09-13 NOTE — Telephone Encounter (Signed)
Chart reviewed, referral placed. Please let patient know they should hear about an appointment  Latrelle Dodrill, MD

## 2020-09-13 NOTE — Telephone Encounter (Signed)
Patients mother is calling there was supposed to be a surgical referral put in for [facility], and it is not placed. Please advise. Sending to doctor covering Dr. Deirdre Priest. Thanks

## 2020-09-14 NOTE — Telephone Encounter (Signed)
Attempted to call pt and no answer. Will try again later. Elizabeth Howell Elizabeth Howell, CMA

## 2020-09-14 NOTE — Telephone Encounter (Signed)
Patient called to check on status of referral. Informed patient that referral was placed and they should be hearing from surgeon to schedule.

## 2020-09-15 ENCOUNTER — Ambulatory Visit (HOSPITAL_COMMUNITY)
Admission: EM | Admit: 2020-09-15 | Discharge: 2020-09-15 | Disposition: A | Discharge status: Home / Self Care | Payer: Medicaid Other | Attending: Emergency Medicine | Admitting: Emergency Medicine

## 2020-09-15 ENCOUNTER — Encounter (HOSPITAL_COMMUNITY): Payer: Self-pay | Admitting: Emergency Medicine

## 2020-09-15 DIAGNOSIS — L309 Dermatitis, unspecified: Secondary | ICD-10-CM | POA: Diagnosis not present

## 2020-09-15 DIAGNOSIS — L732 Hidradenitis suppurativa: Secondary | ICD-10-CM

## 2020-09-15 MED ORDER — TRIAMCINOLONE ACETONIDE 0.025 % EX OINT: 1.0000 "application " | TOPICAL_OINTMENT | Freq: Two times a day (BID) | CUTANEOUS | 0 refills | Status: AC

## 2020-09-15 MED ORDER — HIBICLENS 4 % EX LIQD: Freq: Every day | CUTANEOUS | 0 refills | Status: DC | PRN

## 2020-09-15 MED ORDER — MUPIROCIN CALCIUM 2 % EX CREA: 1.0000 "application " | TOPICAL_CREAM | Freq: Two times a day (BID) | CUTANEOUS | 0 refills | Status: DC

## 2020-09-15 MED ORDER — NYSTATIN 100000 UNIT/GM EX OINT: 1.0000 "application " | TOPICAL_OINTMENT | Freq: Two times a day (BID) | CUTANEOUS | 0 refills | Status: DC

## 2020-09-15 NOTE — ED Triage Notes (Signed)
PT C/O: here for recurrent abscess on right axilla onset 2 weeks  Sx also include yellowish drainage and pain  Seen here on 07/31/20 for same sx  Also, was able to see dermatologist and was referred to surgeon  DENIES: f/v/n/d   TAKING MEDS:   A&O x4... NAD... Ambulatory

## 2020-09-15 NOTE — ED Provider Notes (Signed)
HPI  SUBJECTIVE:  Elizabeth Howell is a 24 y.o. female who presents burning, achy, itchy pain and hyperpigmented, irritated skin in her right axilla for the past month. Seen here 9/17, had an I&D for a right  axillary abscess. Seen again on 11/15, was sent home with Bactrim and advised to follow-up with Derm.  Seen by both PMD and dermatology on 12/16 was referred to surgery.  She states that the yellowish drainage that she has had for the past month has decreased in amount.  She denies fevers, body aches, erythema, swelling, increased temperature.  She has tried applying Vaseline to the area of discomfort without improvement in symptoms.  Symptoms are worse when she tries to cover it. She has a past medical history of history of hiradenitis suppurativa, prediabetes.  No history of MRSA.  Wound culture on February 21 grew out normal skin flora.  LMP: Early December.  Denies possibility being pregnant. PMD: [facility]  Past Medical History:  Diagnosis Date  . ADHD (attention deficit hyperactivity disorder)    no current med.  . Depression   . Hidradenitis suppurativa of left axilla 07/2017  . History of asthma    no current med.  . Hyperlipidemia   . Nocturia   . Pre-diabetes     Past Surgical History:  Procedure Laterality Date  . HYDRADENITIS EXCISION Left 07/30/2017   Procedure: EXCISION LEFT AXILLA HIDRADENITIS;  Surgeon: Erroll Luna, MD;  Location: [place];  Service: General;  Laterality: Left;  . WISDOM TOOTH EXTRACTION      Family History  Problem Relation Age of Onset  . Hypertension Maternal Grandfather   . Diabetes Other     Social History   Tobacco Use  . Smoking status: Never Smoker  . Smokeless tobacco: Never Used  Vaping Use  . Vaping Use: Never used  Substance Use Topics  . Alcohol use: No    Alcohol/week: 0.0 standard drinks  . Drug use: No    No current facility-administered medications for this  encounter.  Current Outpatient Medications:  .  mupirocin cream (BACTROBAN) 2 %, Apply 1 application topically 2 (two) times daily., Disp: 15 g, Rfl: 0 .  nystatin ointment (MYCOSTATIN), Apply 1 application topically 2 (two) times daily. Until symptoms resolve, Disp: 30 g, Rfl: 0 .  triamcinolone (KENALOG) 0.025 % ointment, Apply 1 application topically 2 (two) times daily for 14 days., Disp: 30 g, Rfl: 0 .  atorvastatin (LIPITOR) 40 MG tablet, Take 1 tablet (40 mg total) by mouth daily., Disp: 90 tablet, Rfl: 3 .  cholecalciferol (VITAMIN D) 1000 units tablet, Take 1 tablet (1,000 Units total) by mouth daily., Disp: 90 tablet, Rfl: 3 .  metFORMIN (GLUCOPHAGE) 500 MG tablet, Take 1 tablet (500 mg total) by mouth daily with breakfast., Disp: 90 tablet, Rfl: 1 .  naproxen (NAPROSYN) 500 MG tablet, Take 1 tablet (500 mg total) by mouth 2 (two) times daily with a meal., Disp: 30 tablet, Rfl: 0 .  Omega-3 Fatty Acids (FISH OIL) 1000 MG CAPS, Take 1 capsule (1,000 mg total) by mouth daily., Disp: 30 capsule, Rfl: 11 .  sertraline (ZOLOFT) 100 MG tablet, Take 100 mg by mouth daily., Disp: , Rfl:   No Known Allergies   ROS  As noted in HPI.   Physical Exam  BP (!) 119/93   Pulse 87   Temp 98.5 F (36.9 C) (Oral)   Resp 16   LMP 08/05/2020   SpO2 96%  Constitutional: Well developed, well nourished, no acute distress Eyes:  EOMI, conjunctiva normal bilaterally HENT: Normocephalic, atraumatic,mucus membranes moist Respiratory: Normal inspiratory effort Cardiovascular: Normal rate GI: nondistended skin: Large symmetric tender area of hyperpigmented skin with a 2 cm sinus tract.  Right axilla no expressible purulent drainage, no induration.    Musculoskeletal: no deformities Neurologic: Alert & oriented x 3, no focal neuro deficits Psychiatric: Speech and behavior appropriate   ED Course   Medications - No data to display  No orders of the defined types were placed in this  encounter.   No results found for this or any previous visit (from the past 24 hour(s)). No results found.  ED Clinical Impression  1. Dermatitis   2. Hidradenitis suppurativa of right axilla      ED Assessment/Plan  Previous records, labs reviewed.  As noted in HPI.  Patient's axillary skin appears very irritated, lichenified, worse than when she was seen on 12/16.  Given reports of burning, itching pain wonder if she does not have a secondary yeast infection or a superficial skin infection.  Do not think that she needs oral antibiotics.  Will send home with Hibiclens, Bactroban, nystatin ointment, mixed with hydrocortisone ointment.  She is to put a barrier cream such as zinc or A&D cream on top of that.  Give this 3 to 5 days, if no better, follow-up with PMD.  Discussed  MDM, plan and followup with patient. Patient  agrees with plan.   Meds ordered this encounter  Medications  . mupirocin cream (BACTROBAN) 2 %    Sig: Apply 1 application topically 2 (two) times daily.    Dispense:  15 g    Refill:  0  . triamcinolone (KENALOG) 0.025 % ointment    Sig: Apply 1 application topically 2 (two) times daily for 14 days.    Dispense:  30 g    Refill:  0  . nystatin ointment (MYCOSTATIN)    Sig: Apply 1 application topically 2 (two) times daily. Until symptoms resolve    Dispense:  30 g    Refill:  0    *This clinic note was created using Scientist, clinical (histocompatibility and immunogenetics). Therefore, there may be occasional mistakes despite careful proofreading.  ?    Domenick Gong, MD 09/17/20 1008

## 2020-09-15 NOTE — Discharge Instructions (Addendum)
Clean the area with Hibiclens.  Thoroughly dry with a dryer on low heat setting to maximize moisture removal. Then apply the nystatin and Bactroban ointment. Then apply a layer of zinc paste over the ointment. You can also apply triamcinolone ointment with the nystatin and Bactroban in the minimal amount necessary to reduce inflammation if needed.

## 2020-09-22 DIAGNOSIS — L02411 Cutaneous abscess of right axilla: Secondary | ICD-10-CM | POA: Diagnosis not present

## 2020-10-13 DIAGNOSIS — L732 Hidradenitis suppurativa: Secondary | ICD-10-CM | POA: Diagnosis not present

## 2020-11-03 DIAGNOSIS — L732 Hidradenitis suppurativa: Secondary | ICD-10-CM | POA: Diagnosis not present

## 2021-04-12 ENCOUNTER — Other Ambulatory Visit (INDEPENDENT_AMBULATORY_CARE_PROVIDER_SITE_OTHER): Payer: Self-pay | Admitting: Primary Care

## 2021-04-12 DIAGNOSIS — Z760 Encounter for issue of repeat prescription: Secondary | ICD-10-CM

## 2021-04-12 DIAGNOSIS — E7841 Elevated Lipoprotein(a): Secondary | ICD-10-CM

## 2021-04-12 NOTE — Telephone Encounter (Signed)
Requested medication (s) are due for refill today: no  Requested medication (s) are on the active medication list: yes   Last refill:  11/23/2020  Future visit scheduled: no  Notes to clinic:  overdue for  labs    Requested Prescriptions  Pending Prescriptions Disp Refills   atorvastatin (LIPITOR) 40 MG tablet [Pharmacy Med Name: ATORVASTATIN 40 MG TABLET] 90 tablet 3    Sig: TAKE 1 TABLET BY MOUTH EVERY DAY      Cardiovascular:  Antilipid - Statins Failed - 04/12/2021  2:29 PM      Failed - Total Cholesterol in normal range and within 360 days    Cholesterol, Total  Date Value Ref Range Status  05/18/2020 275 (H) 100 - 199 mg/dL Final          Failed - LDL in normal range and within 360 days    LDL Chol Calc (NIH)  Date Value Ref Range Status  05/18/2020 204 (H) 0 - 99 mg/dL Final          Failed - Triglycerides in normal range and within 360 days    Triglycerides  Date Value Ref Range Status  05/18/2020 155 (H) 0 - 149 mg/dL Final          Passed - HDL in normal range and within 360 days    HDL  Date Value Ref Range Status  05/18/2020 42 >39 mg/dL Final          Passed - Patient is not pregnant      Passed - Valid encounter within last 12 months    Recent Outpatient Visits           10 months ago Annual physical exam   [place], [place], MD   1 year ago Prediabetes   [place], Michelle P, NP   1 year ago Allergic contact dermatitis due to other agents   [place], [place], NP   1 year ago Prediabetes   [place] Kerin Perna, NP   2 years ago Insulin resistance   [place], [place] P, NP                  metFORMIN (GLUCOPHAGE) 500 MG tablet [Pharmacy Med Name: METFORMIN HCL 500 MG TABLET] 90 tablet 1    Sig: TAKE 1 [place]       Endocrinology:  Diabetes - Biguanides Failed - 04/12/2021  2:29 PM      Failed - Cr in normal range and within 360 days    Creat  Date Value Ref Range Status  12/09/2017 0.78 0.50 - 1.00 mg/dL Final   Creatinine, Ser  Date Value Ref Range Status  03/10/2020 0.84 0.57 - 1.00 mg/dL Final          Failed - HBA1C is between 0 and 7.9 and within 180 days    Hemoglobin A1C  Date Value Ref Range Status  03/10/2020 5.7 (A) 4.0 - 5.6 % Final          Failed - AA eGFR in normal range and within 360 days    GFR calc Af Amer  Date Value Ref Range Status  03/10/2020 115 >59 mL/min/1.73 Final    Comment:    **[facility] currently reports eGFR in compliance with the current**   recommendations of the [employer]. [facility]  will   update reporting as new guidelines are published from the NKF-ASN   Task force.    GFR calc non Af Amer  Date Value Ref Range Status  03/10/2020 100 >59 mL/min/1.73 Final          Failed - Valid encounter within last 6 months    Recent Outpatient Visits           10 months ago Annual physical exam   [place], [place], MD   1 year ago Prediabetes   [place], [place], NP   1 year ago Allergic contact dermatitis due to other agents   [place], Clarissa, NP   1 year ago Prediabetes   [place] Kerin Perna, NP   2 years ago Insulin resistance   [place], NP

## 2021-04-13 ENCOUNTER — Other Ambulatory Visit (INDEPENDENT_AMBULATORY_CARE_PROVIDER_SITE_OTHER): Payer: Self-pay | Admitting: Primary Care

## 2021-04-13 DIAGNOSIS — Z760 Encounter for issue of repeat prescription: Secondary | ICD-10-CM

## 2021-04-13 NOTE — Telephone Encounter (Signed)
Attempted to contact patient no answer Appointment need per office

## 2021-04-17 ENCOUNTER — Telehealth (INDEPENDENT_AMBULATORY_CARE_PROVIDER_SITE_OTHER): Payer: Self-pay | Admitting: Primary Care

## 2021-04-17 NOTE — Telephone Encounter (Signed)
Denied until patient presents for appt. No labs in over a year.

## 2021-04-17 NOTE — Telephone Encounter (Signed)
Pt has an appt with michelle on 05-07-2021 and would like a refill on atorvastatin 40 mg and metformin 500 mg. [facility] [phone] in [place] phone number [phone]

## 2021-05-07 ENCOUNTER — Other Ambulatory Visit: Payer: Self-pay

## 2021-05-07 ENCOUNTER — Encounter (INDEPENDENT_AMBULATORY_CARE_PROVIDER_SITE_OTHER): Payer: Self-pay | Admitting: Primary Care

## 2021-05-07 ENCOUNTER — Ambulatory Visit (INDEPENDENT_AMBULATORY_CARE_PROVIDER_SITE_OTHER): Payer: Medicaid Other | Admitting: Primary Care

## 2021-05-07 VITALS — BP 99/74 | HR 91 | Temp 97.3°F | Ht 63.5 in | Wt 277.6 lb

## 2021-05-07 DIAGNOSIS — S90422A Blister (nonthermal), left great toe, initial encounter: Secondary | ICD-10-CM | POA: Diagnosis not present

## 2021-05-07 DIAGNOSIS — R7303 Prediabetes: Secondary | ICD-10-CM

## 2021-05-07 DIAGNOSIS — F32A Depression, unspecified: Secondary | ICD-10-CM | POA: Diagnosis not present

## 2021-05-07 DIAGNOSIS — Z6841 Body Mass Index (BMI) 40.0 and over, adult: Secondary | ICD-10-CM | POA: Diagnosis not present

## 2021-05-07 DIAGNOSIS — F419 Anxiety disorder, unspecified: Secondary | ICD-10-CM | POA: Diagnosis not present

## 2021-05-07 DIAGNOSIS — E785 Hyperlipidemia, unspecified: Secondary | ICD-10-CM

## 2021-05-07 LAB — POCT GLYCOSYLATED HEMOGLOBIN (HGB A1C): Hemoglobin A1C: 5.8 % — AB (ref 4.0–5.6)

## 2021-05-07 MED ORDER — BUPROPION HCL ER (SR) 150 MG PO TB12: 150.0000 mg | ORAL_TABLET | Freq: Two times a day (BID) | ORAL | 1 refills | Status: DC

## 2021-05-07 NOTE — Patient Instructions (Addendum)
HPV Vaccine Information for Parents  HPV (human papillomavirus) is a common virus that spreads easily from person to person through skin-to-skin or sexual contact. There are many types of HPV viruses. They can cause warts in the genitals (genital or mucosal HPV), or on the hands or feet (cutaneous or nonmucosal HPV). Some genital HPV types are considered high-risk and may cause cancer. Your child can get a vaccination to help prevent certain HPV infections that can cause cancer as well as those types that cause genital and anal warts. The vaccine is safe and effective. It is recommended for boys and girls at about 40-42 years of age. Getting the vaccine at this age (before he or she is sexually active) gives your child the best protection from HPV infectionthrough adulthood. How can HPV affect my child? HPV infection can cause: Genital warts. Mouth or throat cancer. Anal cancer. Cervical, vulvar, or vaginal cancer. Penile cancer. During pregnancy, HPV infection can be passed to the baby. This infection cancause warts to develop in a baby's throat and windpipe. What actions can I take to lower my child's risk for HPV? To lower your child's risk for genital HPV infection, have him or her get the HPV vaccine before becoming sexually active. The best time for vaccination is between ages 68 and 40, though it can be given to children as young as 40 years old. If your child gets the first dose before age 10, the vaccination can be given as 2 shots, 6-12 months apart. In some situations, 3 doses are needed. If your child starts the vaccine before age 6 but does not have a second dose within 6-12 months after the first dose, he or she will need 3 doses to complete the vaccination. When your child has the first dose, it is important to make an appointment for the next shot and keep the appointment. Teens who are not vaccinated before age 79 will need 3 doses, within six months of the first dose. If your child  has a weak immune system, he or she may need 3 doses. Young adults can also get the vaccination, even if they are already sexually active and even if they have already been infected with HPV. The vaccination can still help prevent the types of cancer-causing HPV that a person has notbeen infected with. What are the risks and benefits of the HPV vaccine? Benefits The main benefit of getting vaccinated is to prevent certain cancers, including: Cervical, vulvar, and vaginal cancer in females. Penile cancer in males. Oral and anal cancer in both males and females. The risk of these cancers is lower if your child gets vaccinated before he orshe becomes sexually active. The vaccine also prevents genital warts caused by HPV. Risks The risks, although low, include side effects or reactions to the vaccine. Very few reactions have been reported, but they can include: Soreness, redness, or swelling at the injection site. Dizziness or headache. Fever. Who should not get the HPV vaccine or should wait to get it? Some children should not get the HPV vaccine or should wait. Discuss the risks and benefits of the vaccine with your child's health care provider if your child: Has had a severe allergic reaction to other vaccinations. Is allergic to yeast. Has a fever. Has had a recent illness. Is pregnant or may be pregnant. Where to find more information Centers for Disease Control and Prevention: https://www.boyd-meyer.org/ American Academy of Pediatrics: healthychildren.org Summary HPV (human papillomavirus) is a common virus that spreads from person  to person through skin-to-skin or sexual contact. It can spread during vaginal, anal, or oral sex. Your child can get a vaccination to prevent HPV infection and cancer. It is best to get the vaccination before becoming sexually active. The HPV vaccine can protect your child from genital warts and certain types of cancer, including cancer of the cervix, throat, mouth, vulva,  vagina, anus, and penis. The HPV vaccine is both safe and effective. The best time for boys and girls to get the vaccination is when they are between ages 78 and 13. This information is not intended to replace advice given to you by your health care provider. Make sure you discuss any questions you have with your healthcare provider. Document Revised: 05/09/2020 Document Reviewed: 04/18/2020 Elsevier Patient Education  [place]. Bupropion Tablets (Depression/Mood Disorders) What is this medication? BUPROPION (byoo PROE pee on) treats depression. It increases norepinephrine and dopamine in the brain, hormones that help regulate mood. It belongs to a groupof medications called NDRIs. This medicine may be used for other purposes; ask your health care provider orpharmacist if you have questions. COMMON BRAND NAME(S): Wellbutrin What should I tell my care team before I take this medication? They need to know if you have any of these conditions: An eating disorder, such as anorexia or bulimia Bipolar disorder or psychosis Diabetes or high blood sugar, treated with medication Glaucoma Heart disease, previous heart attack, or irregular heart beat Head injury or brain tumor High blood pressure Kidney or liver disease Seizures Suicidal thoughts or a previous suicide attempt Tourette's syndrome Weight loss An unusual or allergic reaction to bupropion, other medications, foods, dyes, or preservatives Pregnant or trying to become pregnant Breast-feeding How should I use this medication? Take this medication by mouth with a glass of water. Follow the directions on the prescription label. You can take it with or without food. If it upsets your stomach, take it with food. Take your medication at regular intervals. Do not take your medication more often than directed. Do not stop taking this medication suddenly except upon the advice of your care team. Stopping this medication too quickly may  cause serious side effects or your condition mayworsen. A special MedGuide will be given to you by the pharmacist with eachprescription and refill. Be sure to read this information carefully each time. Talk to your care team regarding the use of this medication in children.Special care may be needed. Overdosage: If you think you have taken too much of this medicine contact apoison control center or emergency room at once. NOTE: This medicine is only for you. Do not share this medicine with others. What if I miss a dose? If you miss a dose, take it as soon as you can. If it is less than four hours to your next dose, take only that dose and skip the missed dose. Do not takedouble or extra doses. What may interact with this medication? Do not take this medication with any of the following: Linezolid MAOIs like Azilect, Carbex, Eldepryl, Marplan, Nardil, and Parnate Methylene blue (injected into a vein) Other medications that contain bupropion like Zyban This medication may also interact with the following: Alcohol Certain medications for anxiety or sleep Certain medications for blood pressure like metoprolol, propranolol Certain medications for depression or psychotic disturbances Certain medications for HIV or AIDS like efavirenz, lopinavir, nelfinavir, ritonavir Certain medications for irregular heart beat like propafenone, flecainide Certain medications for Parkinson's disease like amantadine, levodopa Certain medications for seizures like carbamazepine, phenytoin,  phenobarbital Cimetidine Clopidogrel Cyclophosphamide Digoxin Furazolidone Isoniazid Nicotine Orphenadrine Procarbazine Steroid medications like prednisone or cortisone Stimulant medications for attention disorders, weight loss, or to stay awake Tamoxifen Theophylline Thiotepa Ticlopidine Tramadol Warfarin This list may not describe all possible interactions. Give your health care provider a list of all the  medicines, herbs, non-prescription drugs, or dietary supplements you use. Also tell them if you smoke, drink alcohol, or use illegaldrugs. Some items may interact with your medicine. What should I watch for while using this medication? Tell your care team if your symptoms do not get better or if they get worse. Visit your care team for regular checks on your progress. Because it may take several weeks to see the full effects of this medication, it is important tocontinue your treatment as prescribed. Watch for new or worsening thoughts of suicide or depression. This includes sudden changes in mood, behavior, or thoughts. These changes can happen at any time but are more common in the beginning of treatment or after a change in dose. Call your care team right away if you experience these thoughts orworsening depression. Manic episodes may happen in patients with bipolar disorder who take this medication. Watch for changes in feelings or behaviors such as feeling anxious, nervous, agitated, panicky, irritable, hostile, aggressive, impulsive, severely restless, overly excited and hyperactive, or trouble sleeping. These symptoms can happen at anytime but are more common in the beginning of treatment or after a change in dose. Call your care team right away if you notice any ofthese symptoms. This medication may cause serious skin reactions. They can happen weeks to months after starting the medication. Contact your care team right away if you notice fevers or flu-like symptoms with a rash. The rash may be red or purple and then turn into blisters or peeling of the skin. Or, you might notice a red rash with swelling of the face, lips or lymph nodes in your neck or under yourarms. Avoid drinks that contain alcohol while taking this medication. Drinking large amounts of alcohol, using sleeping or anxiety medications, or quickly stopping the use of these agents while taking this medication may increase your risk fora  seizure. Do not drive or use heavy machinery until you know how this medication affectsyou. This medication can impair your ability to perform these tasks. Do not take this medication close to bedtime. It may prevent you from sleeping. Your mouth may get dry. Chewing sugarless gum or sucking hard candy, and drinking plenty of water may help. Contact your care team if the problem doesnot go away or is severe. What side effects may I notice from receiving this medication? Side effects that you should report to your care team as soon as possible: Allergic reactions-skin rash, itching, hives, swelling of the face, lips, tongue, or throat Increase in blood pressure Mood and behavior changes-anxiety, nervousness, confusion, hallucinations, irritability, hostility, thoughts of suicide or self-harm, worsening mood, feelings of depression Redness, blistering, peeling, or loosening of the skin, including inside the mouth Seizures Sudden eye pain or change in vision such as blurry vision, seeing halos around lights, vision loss Side effects that usually do not require medical attention (report to your careteam if they continue or are bothersome): Constipation Dizziness Dry mouth Loss of appetite Nausea Tremors or shaking Trouble sleeping This list may not describe all possible side effects. Call your doctor for medical advice about side effects. You may report side effects to FDA at1-800-FDA-1088. Where should I keep my medication? Keep out of  the reach of children and pets. Store at room temperature between 20 and 25 degrees C (68 and 77 degrees F), away from direct sunlight and moisture. Keep tightly closed. Throw away anyunused medication after the expiration date. NOTE: This sheet is a summary. It may not cover all possible information. If you have questions about this medicine, talk to your doctor, pharmacist, orhealth care provider.  2022 Elsevier/Gold Standard (2020-11-14 14:26:11)

## 2021-05-08 LAB — CMP14+EGFR
ALT: 29 IU/L (ref 0–32)
AST: 22 IU/L (ref 0–40)
Albumin/Globulin Ratio: 1.4 (ref 1.2–2.2)
Albumin: 4.1 g/dL (ref 3.9–5.0)
Alkaline Phosphatase: 99 IU/L (ref 44–121)
BUN/Creatinine Ratio: 8 — ABNORMAL LOW (ref 9–23)
BUN: 6 mg/dL (ref 6–20)
Bilirubin Total: 0.3 mg/dL (ref 0.0–1.2)
CO2: 23 mmol/L (ref 20–29)
Calcium: 9.8 mg/dL (ref 8.7–10.2)
Chloride: 100 mmol/L (ref 96–106)
Creatinine, Ser: 0.8 mg/dL (ref 0.57–1.00)
Globulin, Total: 3 g/dL (ref 1.5–4.5)
Glucose: 87 mg/dL (ref 65–99)
Potassium: 4.3 mmol/L (ref 3.5–5.2)
Sodium: 139 mmol/L (ref 134–144)
Total Protein: 7.1 g/dL (ref 6.0–8.5)
eGFR: 106 mL/min/{1.73_m2} (ref >59)

## 2021-05-08 LAB — LIPID PANEL
Chol/HDL Ratio: 7.1 ratio — ABNORMAL HIGH (ref 0.0–4.4)
Cholesterol, Total: 276 mg/dL — ABNORMAL HIGH (ref 100–199)
HDL: 39 mg/dL — ABNORMAL LOW (ref >39)
LDL Chol Calc (NIH): 205 mg/dL — ABNORMAL HIGH (ref 0–99)
Triglycerides: 168 mg/dL — ABNORMAL HIGH (ref 0–149)
VLDL Cholesterol Cal: 32 mg/dL (ref 5–40)

## 2021-05-08 LAB — TSH+FREE T4
Free T4: 1.15 ng/dL (ref 0.82–1.77)
TSH: 2.15 u[IU]/mL (ref 0.450–4.500)

## 2021-05-09 NOTE — Progress Notes (Signed)
Established Patient Office Visit  Subjective:  Patient ID: Elizabeth Howell, female    DOB: 12-Nov-1995  Age: 25 y.o. MRN: [id number]  CC: Establish care  HPI Elizabeth Howell is a 25 year old morbid obese presents for establishment of care . She voiced concerns with darken great toe, anxiety and depression.   Past Medical History:  Diagnosis Date   ADHD (attention deficit hyperactivity disorder)    no current med.   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   Hyperlipidemia    Nocturia    Pre-diabetes     Past Surgical History:  Procedure Laterality Date   HYDRADENITIS EXCISION Left 07/30/2017   Procedure: EXCISION LEFT AXILLA HIDRADENITIS;  Surgeon: Erroll Luna, MD;  Location: [facility];  Service: General;  Laterality: Left;   WISDOM TOOTH EXTRACTION      Family History  Problem Relation Age of Onset   Hypertension Maternal Grandfather    Diabetes Other     Social History   Socioeconomic History   Marital status: Single    Spouse name: Not on file   Number of children: Not on file   Years of education: Not on file   Highest education level: Not on file  Occupational History   Not on file  Tobacco Use   Smoking status: Never   Smokeless tobacco: Never  Vaping Use   Vaping Use: Never used  Substance and Sexual Activity   Alcohol use: No    Alcohol/week: 0.0 standard drinks   Drug use: No   Sexual activity: Not on file  Other Topics Concern   Not on file  Social History Narrative   Is in 9th grade at [employer] Determinants of Health   Financial Resource Strain: Not on file  Food Insecurity: Not on file  Transportation Needs: Not on file  Physical Activity: Not on file  Stress: Not on file  Social Connections: Not on file  Intimate Partner Violence: Not on file    Outpatient Medications Prior to Visit  Medication Sig Dispense Refill   Omega-3  Fatty Acids (FISH OIL) 1000 MG CAPS Take 1 capsule (1,000 mg total) by mouth daily. (Patient not taking: Reported on 05/07/2021) 30 capsule 11   atorvastatin (LIPITOR) 40 MG tablet Take 1 tablet (40 mg total) by mouth daily. 90 tablet 3   chlorhexidine (HIBICLENS) 4 % external liquid Apply topically daily as needed. Dilute 10-15 mL in water, Use daily when bathing for 1-2 weeks 120 mL 0   cholecalciferol (VITAMIN D) 1000 units tablet Take 1 tablet (1,000 Units total) by mouth daily. 90 tablet 3   metFORMIN (GLUCOPHAGE) 500 MG tablet Take 1 tablet (500 mg total) by mouth daily with breakfast. (Patient not taking: Reported on 05/07/2021) 90 tablet 1   mupirocin cream (BACTROBAN) 2 % Apply 1 application topically 2 (two) times daily. 15 g 0   naproxen (NAPROSYN) 500 MG tablet Take 1 tablet (500 mg total) by mouth 2 (two) times daily with a meal. 30 tablet 0   nystatin ointment (MYCOSTATIN) Apply 1 application topically 2 (two) times daily. Until symptoms resolve 30 g 0   sertraline (ZOLOFT) 100 MG tablet Take 100 mg by mouth daily. (Patient not taking: Reported on 05/07/2021)     No facility-administered medications prior to visit.    No Known Allergies  ROS Review of Systems  Skin:  Discolored great  toe left foot  Psychiatric/Behavioral:  Positive for agitation. The patient is nervous/anxious.   All other systems reviewed and are negative.    Objective:    Physical Exam Constitutional:      Comments: Morbid severe   HENT:     Head: Normocephalic.     Right Ear: Tympanic membrane and external ear normal.     Left Ear: Tympanic membrane and external ear normal.     Nose: Nose normal.  Eyes:     Extraocular Movements: Extraocular movements intact.     Pupils: Pupils are equal, round, and reactive to light.  Neck:     Comments: Thick  Cardiovascular:     Rate and Rhythm: Normal rate and regular rhythm.     Pulses: Normal pulses.     Heart sounds: Normal heart sounds.   Pulmonary:     Effort: Pulmonary effort is normal.     Breath sounds: Normal breath sounds.  Abdominal:     General: Bowel sounds are normal.     Palpations: Abdomen is soft.  Musculoskeletal:        General: Normal range of motion.     Cervical back: Normal range of motion and neck supple.  Skin:    General: Skin is warm and dry.  Neurological:     Mental Status: She is alert and oriented to person, place, and time.  Psychiatric:        Mood and Affect: Mood normal.        Behavior: Behavior normal.        Thought Content: Thought content normal.        Judgment: Judgment normal.   BP 99/74 (BP Location: Right Arm, Patient Position: Sitting, Cuff Size: Large)   Pulse 91   Temp (!) 97.3 F (36.3 C) (Temporal)   Ht 5' 3.5" (1.613 m)   Wt 277 lb 9.6 oz (125.9 kg)   LMP 04/23/2021 (Exact Date)   SpO2 98%   BMI 48.40 kg/m  Wt Readings from Last 3 Encounters:  05/07/21 277 lb 9.6 oz (125.9 kg)  08/31/20 271 lb 9.6 oz (123.2 kg)  06/02/20 272 lb (123.4 kg)     Health Maintenance Due  Topic Date Due   PNEUMOCOCCAL POLYSACCHARIDE VACCINE AGE 46-64 HIGH RISK  Never done   COVID-19 Vaccine (1) Never done   HPV VACCINES (1 - 2-dose series) Never done   OPHTHALMOLOGY EXAM  09/27/2020   FOOT EXAM  03/10/2021   URINE MICROALBUMIN  03/10/2021   INFLUENZA VACCINE  04/16/2021       Topic Date Due   HPV VACCINES (1 - 2-dose series) Never done    Lab Results  Component Value Date   TSH 2.150 05/07/2021   Lab Results  Component Value Date   WBC 9.3 03/10/2020   HGB 14.5 03/10/2020   HCT 43.3 03/10/2020   MCV 91 03/10/2020   PLT 315 03/10/2020   Lab Results  Component Value Date   NA 139 05/07/2021   K 4.3 05/07/2021   CO2 23 05/07/2021   GLUCOSE 87 05/07/2021   BUN 6 05/07/2021   CREATININE 0.80 05/07/2021   BILITOT 0.3 05/07/2021   ALKPHOS 99 05/07/2021   AST 22 05/07/2021   ALT 29 05/07/2021   PROT 7.1 05/07/2021   ALBUMIN 4.1 05/07/2021   CALCIUM 9.8  05/07/2021   EGFR 106 05/07/2021   Lab Results  Component Value Date   CHOL 276 (H) 05/07/2021   Lab  Results  Component Value Date   HDL 39 (L) 05/07/2021   Lab Results  Component Value Date   LDLCALC 205 (H) 05/07/2021   Lab Results  Component Value Date   TRIG 168 (H) 05/07/2021   Lab Results  Component Value Date   CHOLHDL 7.1 (H) 05/07/2021   Lab Results  Component Value Date   HGBA1C 5.8 (A) 05/07/2021      Assessment & Plan:  Diagnoses and all orders for this visit:  Prediabetes -     HgB A1c 5.8 Monitor foods that are high in carbohydrates are the following rice, potatoes, breads, sugars, and pastas.  Reduction in the intake (eating) will assist in lowering your blood sugars.   Morbid obesity ([place]) Morbid Obesity is > 40 indicating an excess in caloric intake or underlining conditions. This may lead to other co-morbidities. Lifestyle modifications of diet and exercise may reduce obesity.  Rule out thyroid as a contributing factors. -     TSH + free T4 -     CMP14+EGFR  Dyslipidemia  Healthy lifestyle diet of fruits vegetables fish nuts whole grains and low saturated fat . Foods high in cholesterol or liver, fatty meats,cheese, butter avocados, nuts and seeds, chocolate and fried foods. -     Lipid Panel  Anxiety and depression Flowsheet Row Office Visit from 05/07/2021 in [facility]  PHQ-9 Total Score 11      buPROPion (WELLBUTRIN SR) 150 MG 12 hr tablet; Take 1 tablet (150 mg total) by mouth 2 (two) times daily. Follow up with CSW  Blister of left great toe, initial encounter -     Ambulatory referral to [facility] ordered this encounter  Medications   buPROPion (WELLBUTRIN SR) 150 MG 12 hr tablet    Sig: Take 1 tablet (150 mg total) by mouth 2 (two) times daily.    Dispense:  180 tablet    Refill:  1    Follow-up: Return in about 6 weeks (around 06/18/2021) for depression/anxiety o CSW.    Kerin Perna,  NP

## 2021-05-10 ENCOUNTER — Other Ambulatory Visit (INDEPENDENT_AMBULATORY_CARE_PROVIDER_SITE_OTHER): Payer: Self-pay | Admitting: Primary Care

## 2021-05-10 DIAGNOSIS — E782 Mixed hyperlipidemia: Secondary | ICD-10-CM

## 2021-05-10 MED ORDER — ATORVASTATIN CALCIUM 40 MG PO TABS: 40.0000 mg | ORAL_TABLET | Freq: Every day | ORAL | 3 refills | Status: DC

## 2021-05-11 ENCOUNTER — Ambulatory Visit (INDEPENDENT_AMBULATORY_CARE_PROVIDER_SITE_OTHER): Payer: Medicaid Other | Admitting: Podiatry

## 2021-05-11 ENCOUNTER — Other Ambulatory Visit: Payer: Self-pay

## 2021-05-11 ENCOUNTER — Encounter: Payer: Self-pay | Admitting: Podiatry

## 2021-05-11 DIAGNOSIS — L089 Local infection of the skin and subcutaneous tissue, unspecified: Secondary | ICD-10-CM | POA: Diagnosis not present

## 2021-05-11 DIAGNOSIS — M779 Enthesopathy, unspecified: Secondary | ICD-10-CM

## 2021-05-11 DIAGNOSIS — S90822A Blister (nonthermal), left foot, initial encounter: Secondary | ICD-10-CM | POA: Diagnosis not present

## 2021-05-11 NOTE — Progress Notes (Signed)
Subjective:   Patient ID: Elizabeth Howell, female   DOB: 25 y.o.   MRN: [id number]   HPI Patient presents concerned about blistering of her left big toe and at times in her arch with significant flatfeet and obesity is complicating factor.  Also is a prediabetic.  Patient is not smoking currently likes to be active   Review of Systems  All other systems reviewed and are negative.      Objective:  Physical Exam Vitals and nursing note reviewed.  Constitutional:      Appearance: She is well-developed.  Pulmonary:     Effort: Pulmonary effort is normal.  Musculoskeletal:        General: Normal range of motion.  Skin:    General: Skin is warm.  Neurological:     Mental Status: She is alert.    Neurovascular status intact muscle strength was found to be adequate range of motion adequate.  Patient has some blistering of the left hallux on the side and into the arch is localized with no other pathology noted with obesity noted F2     Assessment:  Foot pathology probably creating stress on the medial side of her left foot which creates a blister formation localized with no infection     Plan:  Patient PE reviewed condition recommended that she try to wear supportive shoes but I do not recommend treatment for these and I encouraged weight loss reviewing that fact with her

## 2021-05-18 ENCOUNTER — Encounter (INDEPENDENT_AMBULATORY_CARE_PROVIDER_SITE_OTHER): Payer: Self-pay

## 2021-05-18 ENCOUNTER — Telehealth (INDEPENDENT_AMBULATORY_CARE_PROVIDER_SITE_OTHER): Payer: Self-pay

## 2021-05-18 NOTE — Telephone Encounter (Signed)
No voicemail setup results mailed. Nat Christen, CMA Copied from [facility] [phone]. Topic: General - Other >> May 18, 2021  9:50 AM Tessa Lerner A wrote: Reason for CRM: Patient's grandmother would like to be contacted regarding results from 05/07/21 when possible   The patient's grandmother would also like the results mailed to the address on file if possible  Please contact further when available

## 2021-05-25 ENCOUNTER — Telehealth: Payer: Self-pay | Admitting: Clinical

## 2021-05-28 ENCOUNTER — Ambulatory Visit (HOSPITAL_COMMUNITY)
Admission: EM | Admit: 2021-05-28 | Discharge: 2021-05-28 | Disposition: A | Discharge status: Home / Self Care | Payer: Medicaid Other | Attending: Internal Medicine | Admitting: Internal Medicine

## 2021-05-28 ENCOUNTER — Encounter (HOSPITAL_COMMUNITY): Payer: Self-pay | Admitting: *Deleted

## 2021-05-28 ENCOUNTER — Other Ambulatory Visit: Payer: Self-pay

## 2021-05-28 DIAGNOSIS — R21 Rash and other nonspecific skin eruption: Secondary | ICD-10-CM | POA: Diagnosis not present

## 2021-05-28 DIAGNOSIS — T7840XA Allergy, unspecified, initial encounter: Secondary | ICD-10-CM | POA: Diagnosis not present

## 2021-05-28 MED ORDER — METHYLPREDNISOLONE SODIUM SUCC 125 MG IJ SOLR
60.0000 mg | Freq: Once | INTRAMUSCULAR | Status: AC
  Administered 2021-05-28: 60 mg via INTRAMUSCULAR

## 2021-05-28 MED ORDER — METHYLPREDNISOLONE SODIUM SUCC 125 MG IJ SOLR
INTRAMUSCULAR | Status: AC
  Filled 2021-05-28: qty 2

## 2021-05-28 MED ORDER — PREDNISONE 20 MG PO TABS: 20.0000 mg | ORAL_TABLET | Freq: Every day | ORAL | 0 refills | Status: AC

## 2021-05-28 NOTE — ED Provider Notes (Signed)
[place]    CSN: [id number] Arrival date & time: 05/28/21  1846      History   Chief Complaint Chief Complaint  Patient presents with   Allergic Reaction   Rash    HPI Elizabeth Howell is a 25 y.o. female comes to urgent care with 1 day history of generalized rash over the face, arms and legs.  Symptoms started last night.  Patient took some Benadryl and when she woke up this morning she had facial swelling.  She denies any change in her diet.  No other family members have similar symptoms.  She has not changed her cosmetics, detergents or bathing soap.  No tongue swelling.  No shortness of breath or wheezing.  No chest tightness.  Patient denies any history of allergies to medications or environmental allergies.Marland Kitchen   HPI  Past Medical History:  Diagnosis Date   ADHD (attention deficit hyperactivity disorder)    no current med.   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      Home Medications    Prior to Admission medications   Medication Sig Start Date End  Date Taking? Authorizing Provider  predniSONE (DELTASONE) 20 MG tablet Take 1 tablet (20 mg total) by mouth daily for 3 days. 05/28/21 05/31/21 Yes Bryam Taborda, Myrene Galas, MD  atorvastatin (LIPITOR) 40 MG tablet Take 1 tablet (40 mg total) by mouth daily. 05/10/21   Kerin Perna, NP  buPROPion (WELLBUTRIN SR) 150 MG 12 hr tablet Take 1 tablet (150 mg total) by mouth 2 (two) times daily. 05/07/21   Kerin Perna, NP  diphenhydrAMINE (BENADRYL) 25 MG tablet Take 1 tablet (25 mg total) by mouth every 6 (six) hours as needed. 05/31/21   Suzy Bouchard, PA-C  EPINEPHrine 0.3 mg/0.3 mL IJ SOAJ injection Inject 0.3 mg into the muscle as needed for anaphylaxis. 05/31/21   Suzy Bouchard, PA-C  famotidine (PEPCID) 20 MG tablet Take 1 tablet (20 mg total) by mouth daily for 5 days. 05/31/21 06/05/21  Suzy Bouchard, PA-C  predniSONE (DELTASONE) 20 MG tablet Take 2 tablets (40 mg total) by mouth daily for 4 days. 05/31/21 06/04/21  Suzy Bouchard, PA-C    Family History Family History  Problem Relation Age of Onset   Hypertension Maternal Grandfather    Diabetes Other     Social History Social History   Tobacco Use   Smoking status: Never   Smokeless tobacco: Never  Vaping Use   Vaping Use: Never used  Substance Use Topics   Alcohol use: No  Alcohol/week: 0.0 standard drinks   Drug use: No     Allergies   Patient has no known allergies.   Review of Systems Review of Systems  HENT: Negative.    Respiratory:  Negative for cough, chest tightness, shortness of breath and wheezing.   Gastrointestinal: Negative.   Skin:  Positive for rash. Negative for color change.  Neurological: Negative.     Physical Exam Triage Vital Signs ED Triage Vitals  Enc Vitals Group     BP 05/28/21 1951 105/69     Pulse Rate 05/28/21 1951 (!) 117     Resp --      Temp 05/28/21 1951 99.9 F (37.7 C)     Temp Source 05/28/21 1951 Oral     SpO2 05/28/21 1951 98 %     Weight --       Height --      Head Circumference --      Peak Flow --      Pain Score 05/28/21 2002 0     Pain Loc --      Pain Edu? --      Excl. in [place]? --    No data found.  Updated Vital Signs BP 105/69 (BP Location: Left Arm)   Pulse (!) 117   Temp 99.9 F (37.7 C) (Oral)   LMP 05/16/2021   SpO2 98%   Visual Acuity Right Eye Distance:   Left Eye Distance:   Bilateral Distance:    Right Eye Near:   Left Eye Near:    Bilateral Near:     Physical Exam Constitutional:      Appearance: Normal appearance.  Eyes:     Comments: Facial swelling.  Mild lip swelling.  No tongue swelling.  No stridorous breathing.  Uvula is central and not swollen.  Cardiovascular:     Rate and Rhythm: Normal rate and regular rhythm.     Pulses: Normal pulses.     Heart sounds: Normal heart sounds.  Pulmonary:     Effort: Pulmonary effort is normal.     Breath sounds: Normal breath sounds.  Musculoskeletal:        General: Normal range of motion.  Skin:    General: Skin is warm.     Comments: Faint urticarial rash is noted on the upper extremities.  Neurological:     Mental Status: She is alert.     UC Treatments / Results  Labs (all labs ordered are listed, but only abnormal results are displayed) Labs Reviewed - No data to display  EKG   Radiology No results found.  Procedures Procedures (including critical care time)  Medications Ordered in UC Medications  methylPREDNISolone sodium succinate (SOLU-MEDROL) 125 mg/2 mL injection 60 mg (60 mg Intramuscular Given 05/28/21 2017)    Initial Impression / Assessment and Plan / UC Course  I have reviewed the triage vital signs and the nursing notes.  Pertinent labs & imaging results that were available during my care of the patient were reviewed by me and considered in my medical decision making (see chart for details).     1.  Allergic reaction: Airway is not threatened Solu-Medrol 60 mg IM x1 dose Prednisone 20 mg orally daily for 3  days Return to ER precautions given Patient is advised to keep a diary of diet and exposures. Final Clinical Impressions(s) / UC Diagnoses   Final diagnoses:  Allergic reaction, initial encounter     Discharge Instructions      Please take  medications as prescribed If you have tongue swelling, noisy breathing, tightness in your throat, shortness of breath and/or wheezing-call 911 or please go to the emergency department immediately      ED Prescriptions     Medication Sig Dispense Auth. Provider   predniSONE (DELTASONE) 20 MG tablet Take 1 tablet (20 mg total) by mouth daily for 3 days. 3 tablet Jazzalynn Rhudy, Myrene Galas, MD      PDMP not reviewed this encounter.   Chase Picket, MD 05/31/21 1606

## 2021-05-28 NOTE — ED Notes (Signed)
Spoke with patient, not in any distress; itching and swelling has been going on since yesterday.

## 2021-05-28 NOTE — Telephone Encounter (Signed)
Spoke with pt and scheduled appt for 06/21/21 at 10:00

## 2021-05-28 NOTE — ED Triage Notes (Signed)
Pt reports itching and rash on face ,arms and legs that started last night.

## 2021-05-28 NOTE — Discharge Instructions (Addendum)
Please take medications as prescribed If you have tongue swelling, noisy breathing, tightness in your throat, shortness of breath and/or wheezing-call 911 or please go to the emergency department immediately

## 2021-05-31 ENCOUNTER — Ambulatory Visit (INDEPENDENT_AMBULATORY_CARE_PROVIDER_SITE_OTHER): Payer: Self-pay

## 2021-05-31 ENCOUNTER — Emergency Department (HOSPITAL_COMMUNITY)
Admission: EM | Admit: 2021-05-31 | Discharge: 2021-05-31 | Disposition: A | Discharge status: Home / Self Care | Payer: Medicaid Other | Attending: Emergency Medicine | Admitting: Emergency Medicine

## 2021-05-31 ENCOUNTER — Other Ambulatory Visit: Payer: Self-pay

## 2021-05-31 ENCOUNTER — Encounter (HOSPITAL_COMMUNITY): Payer: Self-pay | Admitting: Emergency Medicine

## 2021-05-31 DIAGNOSIS — R7309 Other abnormal glucose: Secondary | ICD-10-CM | POA: Insufficient documentation

## 2021-05-31 DIAGNOSIS — T7840XA Allergy, unspecified, initial encounter: Secondary | ICD-10-CM | POA: Insufficient documentation

## 2021-05-31 DIAGNOSIS — I10 Essential (primary) hypertension: Secondary | ICD-10-CM | POA: Insufficient documentation

## 2021-05-31 DIAGNOSIS — J45909 Unspecified asthma, uncomplicated: Secondary | ICD-10-CM | POA: Insufficient documentation

## 2021-05-31 LAB — I-STAT BETA HCG BLOOD, ED (MC, WL, AP ONLY): I-stat hCG, quantitative: <5 m[IU]/mL (ref <5)

## 2021-05-31 LAB — CBG MONITORING, ED: Glucose-Capillary: 147 mg/dL — ABNORMAL HIGH (ref 70–99)

## 2021-05-31 MED ORDER — PREDNISONE 20 MG PO TABS: 40.0000 mg | ORAL_TABLET | Freq: Every day | ORAL | 0 refills | Status: AC

## 2021-05-31 MED ORDER — DIPHENHYDRAMINE HCL 25 MG PO CAPS
25.0000 mg | ORAL_CAPSULE | Freq: Once | ORAL | Status: AC
  Administered 2021-05-31: 25 mg via ORAL
  Filled 2021-05-31: qty 1

## 2021-05-31 MED ORDER — PREDNISONE 20 MG PO TABS
60.0000 mg | ORAL_TABLET | Freq: Once | ORAL | Status: AC
  Administered 2021-05-31: 60 mg via ORAL
  Filled 2021-05-31: qty 3

## 2021-05-31 MED ORDER — EPINEPHRINE 0.3 MG/0.3ML IJ SOAJ: 0.3000 mg | INTRAMUSCULAR | 0 refills | Status: DC | PRN

## 2021-05-31 MED ORDER — DIPHENHYDRAMINE HCL 25 MG PO TABS: 25.0000 mg | ORAL_TABLET | Freq: Four times a day (QID) | ORAL | 0 refills | Status: DC | PRN

## 2021-05-31 MED ORDER — FAMOTIDINE 20 MG PO TABS
20.0000 mg | ORAL_TABLET | Freq: Once | ORAL | Status: AC
  Administered 2021-05-31: 20 mg via ORAL
  Filled 2021-05-31: qty 1

## 2021-05-31 MED ORDER — FAMOTIDINE 20 MG PO TABS: 20.0000 mg | ORAL_TABLET | Freq: Every day | ORAL | 0 refills | Status: DC

## 2021-05-31 NOTE — Telephone Encounter (Signed)
Pt. Seen in ED 05/28/21 for allergic reaction. This morning started having facial swelling again. Left ear "is swollen shut and the inside of my mouth feels swollen." Denies shortness of breath or difficulty swallowing. Family will take pt. To ED now. Instructed to call 911 if shortness of breath or difficulty swallowing occur.    Reason for Disposition  Patient sounds very sick or weak to the triager  Answer Assessment - Initial Assessment Questions 1. ONSET: "When did the swelling start?" (e.g., minutes, hours, days)     This morning 2. LOCATION: "What part of the face is swollen?"     Left ear, right eye 3. SEVERITY: "How swollen is it?"     Moderate 4. ITCHING: "Is there any itching?" If Yes, ask: "How much?"   (Scale 1-10; mild, moderate or severe)     Severe 5. PAIN: "Is the swelling painful to touch?" If Yes, ask: "How painful is it?"   (Scale 1-10; mild, moderate or severe)   - NONE (0): no pain   - MILD (1-3): doesn't interfere with normal activities    - MODERATE (4-7): interferes with normal activities or awakens from sleep    - SEVERE (8-10): excruciating pain, unable to do any normal activities      No 6. FEVER: "Do you have a fever?" If Yes, ask: "What is it, how was it measured, and when did it start?"      No 7. CAUSE: "What do you think is causing the face swelling?"     Reaction 8. RECURRENT SYMPTOM: "Have you had face swelling before?" If Yes, ask: "When was the last time?" "What happened that time?"     Yes 9. OTHER SYMPTOMS: "Do you have any other symptoms?" (e.g., toothache, leg swelling)     No 10. PREGNANCY: "Is there any chance you are pregnant?" "When was your last menstrual period?"       No  Protocols used: Face Swelling-A-AH

## 2021-05-31 NOTE — ED Triage Notes (Signed)
Pt states she feels like she is having an allergic reaction- itching on her face, arms and legs. Pt was seen at [facility] on the 12th- received steroid shot. This seemed to help. But pt states she has started itching again on her neck, face, arms and legs. Pt feels like her neck is a little swollen as well. Pt's left earlobe is swollen, states inside her right cheek is swollen. Airway is patent. No trouble breathing at this time.

## 2021-05-31 NOTE — Discharge Instructions (Addendum)
It was a pleasure taking care of you today. As discussed, your itching could be caused by some allergic reaction to food or some product you are using. I am sending you home with more steroids, pepcid, and benadryl. Take as prescribed. Follow-up with PCP within the next week for further evaluation. Strict ED precautions discussed with patient. Patient states understanding and agrees to plan. Patient discharged home in no acute distress and stable vitals  I am also sending you home with an Epi-pen. Only use if you are having difficulties breathing. If you use that Epi-pen, you must return to the ER for further evaluation.

## 2021-05-31 NOTE — Telephone Encounter (Signed)
FYI

## 2021-05-31 NOTE — ED Provider Notes (Signed)
[facility] EMERGENCY DEPARTMENT Provider Note   CSN: [id number] Arrival date & time: 05/31/21  0859     History Chief Complaint  Patient presents with   Allergic Reaction    Elizabeth Howell is a 25 y.o. female with a past medical history significant for ADHD, depression, asthma, hyperlipidemia, and prediabetes who presents to the ED due to concerns of an allergic reaction.  Patient admits to full body pruritus x4 days.  Patient denies any new medications.  No new products or detergents.  Patient was evaluated at urgent care 2 days ago and given a steroid injection and discharged with prednisone.  Patient states her symptoms initially improved however, returned earlier today.  Patient notes initially 4 days ago she had angioedema however, none since.  Denies difficulties breathing.  Denies abdominal pain, nausea, vomiting. Rash was present 4 days ago, but none since. No fever or chills. No known allergies. No previous allergic reaction. Denies any swelling currently. Denies throat closing sensation. No treatment prior to arrival. No aggravating or alleviating factors.   History obtained from patient and past medical records. No interpreter used during encounter.       Past Medical History:  Diagnosis Date   ADHD (attention deficit hyperactivity disorder)    no current med.   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     Family History  Problem Relation Age of Onset   Hypertension Maternal Grandfather    Diabetes Other     Social History   Tobacco Use   Smoking status: Never   Smokeless tobacco: Never  Vaping Use   Vaping Use: Never used  Substance Use Topics   Alcohol use: No    Alcohol/week: 0.0 standard drinks   Drug use: No    Home Medications Prior to Admission medications   Medication Sig Start Date End Date Taking? Authorizing Provider  diphenhydrAMINE (BENADRYL) 25 MG tablet Take 1 tablet (25 mg total) by mouth every 6 (six) hours as needed. 05/31/21  Yes Salvatore Shear, Druscilla Brownie, PA-C  EPINEPHrine 0.3 mg/0.3 mL IJ SOAJ injection Inject 0.3 mg into the muscle as needed for anaphylaxis. 05/31/21  Yes Ashonti Leandro, Druscilla Brownie, PA-C  famotidine (PEPCID) 20 MG tablet Take 1 tablet (20 mg total) by mouth daily for 5 days. 05/31/21 06/05/21 Yes Camauri Fleece, Druscilla Brownie, PA-C  predniSONE (DELTASONE) 20 MG tablet Take 2 tablets (40 mg total) by mouth daily for 4 days. 05/31/21 06/04/21 Yes Jelicia Nantz, Druscilla Brownie, PA-C  atorvastatin (LIPITOR) 40 MG tablet Take 1 tablet (40 mg total) by  mouth daily. 05/10/21   Kerin Perna, NP  buPROPion (WELLBUTRIN SR) 150 MG 12 hr tablet Take 1 tablet (150 mg total) by mouth 2 (two) times daily. 05/07/21   Kerin Perna, NP  predniSONE (DELTASONE) 20 MG tablet Take 1 tablet (20 mg total) by mouth daily for 3 days. 05/28/21 05/31/21  Chase Picket, MD    Allergies    Patient has no known allergies.  Review of Systems   Review of Systems  Constitutional:  Negative for chills and fever.  HENT:  Negative  for sore throat.   Respiratory:  Negative for shortness of breath.   Cardiovascular:  Negative for chest pain.  Gastrointestinal:  Negative for abdominal pain, diarrhea, nausea and vomiting.  All other systems reviewed and are negative.  Physical Exam Updated Vital Signs BP 104/67 (BP Location: Right Arm)   Pulse (!) 102   Temp 98.5 F (36.9 C) (Oral)   Resp 18   LMP 05/16/2021   SpO2 96%   Physical Exam Vitals and nursing note reviewed.  Constitutional:      General: She is not in acute distress.    Appearance: She is not ill-appearing.  HENT:     Head: Normocephalic.     Mouth/Throat:     Comments: Airway patent. No angioedema.  Eyes:     Pupils: Pupils are equal, round, and reactive to light.  Cardiovascular:     Rate and Rhythm: Normal rate and regular rhythm.     Pulses: Normal pulses.     Heart sounds: Normal heart sounds. No murmur heard.   No friction rub. No gallop.  Pulmonary:     Effort: Pulmonary effort is normal.     Breath sounds: Normal breath sounds.     Comments: Respirations equal and unlabored, patient able to speak in full sentences, lungs clear to auscultation bilaterally. No stridor or wheeze Abdominal:     General: Abdomen is flat. There is no distension.     Palpations: Abdomen is soft.     Tenderness: There is no abdominal tenderness. There is no guarding or rebound.  Musculoskeletal:        General: Normal range of motion.     Cervical back: Neck supple.  Skin:    General: Skin is warm and dry.     Comments: No rash  Neurological:     General: No focal deficit present.     Mental Status: She is alert.  Psychiatric:        Mood and Affect: Mood normal.        Behavior: Behavior normal.    ED Results / Procedures / Treatments   Labs (all labs ordered are listed, but only abnormal results are displayed) Labs Reviewed  CBG MONITORING, ED - Abnormal; Notable for the following components:      Result Value   Glucose-Capillary 147 (*)     All other components within normal limits  I-STAT BETA HCG BLOOD, ED ([facility], [facility], [facility] ONLY)    EKG None  Radiology No results found.  Procedures Procedures   Medications Ordered in ED Medications  diphenhydrAMINE (BENADRYL) capsule 25 mg (25 mg Oral Given 05/31/21 1343)  predniSONE (DELTASONE) tablet 60 mg (60 mg Oral Given 05/31/21 1344)  famotidine (PEPCID) tablet 20 mg (20 mg Oral Given 05/31/21 1344)    ED Course  I have reviewed the triage vital signs and the nursing notes.  Pertinent labs & imaging results that were available during my care of  the patient were reviewed by me and considered in my medical decision making (see chart for details).    MDM Rules/Calculators/A&P                          25 year old female presents to the ED due to suspected allergic reaction due to full body pruritus.  Symptoms started 4 days ago associated with angioedema and rash however, patient was seen at UC 2 days ago given steroids with resolution of swelling and hives. None since. No new medications or products. No known allergies. Upon arrival, stable vitals. Patient mildly tachycardic. Patient in no acute distress. Airway patent. No rash. Lungs clear to auscultation bilaterally without wheeze or stridor. No angioedema. Prednisone, Pepcid, and benadryl given. No signs of anaphylaxis at this time. Will continue to monitor.   CBG mildly elevated at 147. Advised patient patient to keep track of glucose levels while on steroids. Reassessed patient at bedside who notes resolution in pruritus.  No difficulties breathing.  Lungs clear to auscultation bilaterally without stridor or wheeze.  Airway patent.  Patient observed in the ER for over 5 hours with no signs of anaphylaxis.  Patient discharged with prednisone, Pepcid, and Benadryl.  Patient also discharged with EpiPen.  Advised patient to follow-up with PCP within 1 week for further evaluation. Strict ED precautions discussed with patient. Patient states  understanding and agrees to plan. Patient discharged home in no acute distress and stable vitals.  Final Clinical Impression(s) / ED Diagnoses Final diagnoses:  Allergic reaction, initial encounter    Rx / DC Orders ED Discharge Orders          Ordered    famotidine (PEPCID) 20 MG tablet  Daily        05/31/21 1352    diphenhydrAMINE (BENADRYL) 25 MG tablet  Every 6 hours PRN        05/31/21 1352    predniSONE (DELTASONE) 20 MG tablet  Daily        05/31/21 1352    EPINEPHrine 0.3 mg/0.3 mL IJ SOAJ injection  As needed        05/31/21 1447             Suzy Bouchard, PA-C 05/31/21 1453    Lajean Saver, MD 06/01/21 1512

## 2021-05-31 NOTE — ED Notes (Signed)
Pt able to talk in complete sentence, breathing is unlabored and equal. Pt states her main concern is itching and swelling of her ears and face. PA at bedside.

## 2021-06-01 ENCOUNTER — Telehealth: Payer: Self-pay

## 2021-06-01 NOTE — Telephone Encounter (Signed)
Transition Care Management Follow-up Telephone Call Date of discharge and from where: 05/31/2021-[place] [facility]  How have you been since you were released from the hospital? Patient stated she is doing much better.   Any questions or concerns? No  Items Reviewed: Did the pt receive and understand the discharge instructions provided? Yes  Medications obtained and verified? Yes  Other? No  Any new allergies since your discharge? No  Dietary orders reviewed? N/A Do you have support at home? Yes   Home Care and Equipment/Supplies: Were home health services ordered? not applicable If so, what is the name of the agency? N/A  Has the agency set up a time to come to the patient's home? not applicable Were any new equipment or medical supplies ordered?  No What is the name of the medical supply agency? N/A Were you able to get the supplies/equipment? not applicable Do you have any questions related to the use of the equipment or supplies? No  Functional Questionnaire: (I = Independent and D = Dependent) ADLs: I  Bathing/Dressing- I  Meal Prep- I  Eating- I  Maintaining continence- I  Transferring/Ambulation- I  Managing Meds- I  Follow up appointments reviewed:  PCP Hospital f/u appt confirmed? No   Specialist Hospital f/u appt confirmed? No  . Are transportation arrangements needed? No  If their condition worsens, is the pt aware to call PCP or go to the Emergency Dept.? Yes Was the patient provided with contact information for the PCP's office or ED? Yes Was to pt encouraged to call back with questions or concerns? Yes

## 2021-06-06 ENCOUNTER — Telehealth (INDEPENDENT_AMBULATORY_CARE_PROVIDER_SITE_OTHER): Payer: Self-pay | Admitting: Primary Care

## 2021-06-06 NOTE — Telephone Encounter (Signed)
Sent to PCP. Patient was seen at [facility] on 9/15 for allergic reaction

## 2021-06-06 NOTE — Telephone Encounter (Signed)
Copied from [place] [id number]. Topic: Referral - Request for Referral >> Jun 06, 2021  3:25 PM Leward Quan A wrote: Has patient seen PCP for this complaint? Yes.   *If NO, is insurance requiring patient see PCP for this issue before PCP can refer them? Referral for which specialty: Allergist  Preferred provider/office: none chosen  Reason for referral: Swelling in the face  Need to be referred before her appointment in 2 weeks

## 2021-06-08 ENCOUNTER — Other Ambulatory Visit (INDEPENDENT_AMBULATORY_CARE_PROVIDER_SITE_OTHER): Payer: Self-pay | Admitting: Primary Care

## 2021-06-08 DIAGNOSIS — Z9189 Other specified personal risk factors, not elsewhere classified: Secondary | ICD-10-CM

## 2021-06-18 ENCOUNTER — Ambulatory Visit (INDEPENDENT_AMBULATORY_CARE_PROVIDER_SITE_OTHER): Payer: Medicaid Other | Admitting: Primary Care

## 2021-06-18 ENCOUNTER — Other Ambulatory Visit (INDEPENDENT_AMBULATORY_CARE_PROVIDER_SITE_OTHER): Payer: Self-pay | Admitting: Primary Care

## 2021-06-18 ENCOUNTER — Other Ambulatory Visit: Payer: Self-pay

## 2021-06-18 ENCOUNTER — Encounter (INDEPENDENT_AMBULATORY_CARE_PROVIDER_SITE_OTHER): Payer: Self-pay | Admitting: Primary Care

## 2021-06-18 VITALS — BP 105/76 | HR 113 | Temp 97.5°F | Ht 63.5 in | Wt 275.0 lb

## 2021-06-18 DIAGNOSIS — F32A Depression, unspecified: Secondary | ICD-10-CM | POA: Diagnosis not present

## 2021-06-18 DIAGNOSIS — Z6841 Body Mass Index (BMI) 40.0 and over, adult: Secondary | ICD-10-CM | POA: Diagnosis not present

## 2021-06-18 DIAGNOSIS — F419 Anxiety disorder, unspecified: Secondary | ICD-10-CM | POA: Diagnosis not present

## 2021-06-18 DIAGNOSIS — Z23 Encounter for immunization: Secondary | ICD-10-CM | POA: Diagnosis not present

## 2021-06-18 MED ORDER — SAXENDA 18 MG/3ML ~~LOC~~ SOPN: 3.0000 mg | PEN_INJECTOR | Freq: Every day | SUBCUTANEOUS | 3 refills | Status: DC

## 2021-06-18 MED ORDER — BUPROPION HCL ER (SR) 150 MG PO TB12
150.0000 mg | ORAL_TABLET | Freq: Two times a day (BID) | ORAL | 1 refills | Status: DC
Start: 2021-06-18 — End: 2022-02-05

## 2021-06-18 NOTE — Progress Notes (Signed)
Established Patient Office Visit  Subjective:  Patient ID: Elizabeth Howell, female    DOB: 1996/06/12  Age: 25 y.o. MRN: [id number]  CC:  Chief Complaint  Patient presents with   Depression   Anxiety    HPI [name] is a 25 year old morbid obese female who  presents for management of depression and anxiety. She is also contentious of her weight but self esteem has improved on medication.  Past Medical History:  Diagnosis Date   ADHD (attention deficit hyperactivity disorder)    no current med.   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   Hyperlipidemia    Nocturia    Pre-diabetes     Past Surgical History:  Procedure Laterality Date   HYDRADENITIS EXCISION Left 07/30/2017   Procedure: EXCISION LEFT AXILLA HIDRADENITIS;  Surgeon: Erroll Luna, MD;  Location: [facility];  Service: General;  Laterality: Left;   WISDOM TOOTH EXTRACTION      Family History  Problem Relation Age of Onset   Hypertension Maternal Grandfather    Diabetes Other     Social History   Socioeconomic History   Marital status: Single    Spouse name: Not on file   Number of children: Not on file   Years of education: Not on file   Highest education level: Not on file  Occupational History   Not on file  Tobacco Use   Smoking status: Never   Smokeless tobacco: Never  Vaping Use   Vaping Use: Never used  Substance and Sexual Activity   Alcohol use: No    Alcohol/week: 0.0 standard drinks   Drug use: No   Sexual activity: Not on file  Other Topics Concern   Not on file  Social History Narrative   Is in 9th grade at [employer] Determinants of Health   Financial Resource Strain: Not on file  Food Insecurity: Not on file  Transportation Needs: Not on file  Physical Activity: Not on file  Stress: Not on file  Social Connections: Not on file  Intimate Partner Violence: Not  on file    Outpatient Medications Prior to Visit  Medication Sig Dispense Refill   atorvastatin (LIPITOR) 40 MG tablet Take 1 tablet (40 mg total) by mouth daily. 90 tablet 3   diphenhydrAMINE (BENADRYL) 25 MG tablet Take 1 tablet (25 mg total) by mouth every 6 (six) hours as needed. 30 tablet 0   EPINEPHrine 0.3 mg/0.3 mL IJ SOAJ injection Inject 0.3 mg into the muscle as needed for anaphylaxis. 1 each 0   buPROPion (WELLBUTRIN SR) 150 MG 12 hr tablet Take 1 tablet (150 mg total) by mouth 2 (two) times daily. 180 tablet 1   famotidine (PEPCID) 20 MG tablet Take 1 tablet (20 mg total) by mouth daily for 5 days. 5 tablet 0   No facility-administered medications prior to visit.    No Known Allergies  ROS Review of Systems  Psychiatric/Behavioral:  The patient is nervous/anxious.        Depression  All other systems reviewed and are negative.    Objective:    Physical Exam Vitals reviewed.  Constitutional:      Appearance: She is obese.  HENT:     Head: Normocephalic.     Right Ear: Tympanic membrane and external ear normal.     Left Ear: Tympanic membrane and external ear normal.  Nose: Nose normal.  Eyes:     Extraocular Movements: Extraocular movements intact.     Pupils: Pupils are equal, round, and reactive to light.  Cardiovascular:     Rate and Rhythm: Normal rate and regular rhythm.  Pulmonary:     Effort: Pulmonary effort is normal.     Breath sounds: Normal breath sounds.  Abdominal:     General: Bowel sounds are normal. There is distension.     Palpations: Abdomen is soft.  Musculoskeletal:        General: Normal range of motion.     Cervical back: Normal range of motion and neck supple.  Skin:    General: Skin is warm.  Neurological:     Mental Status: She is oriented to person, place, and time.  Psychiatric:        Mood and Affect: Mood normal.        Behavior: Behavior normal.        Thought Content: Thought content normal.        Judgment:  Judgment normal.    BP 105/76 (BP Location: Right Arm, Patient Position: Sitting, Cuff Size: Large)   Pulse (!) 113   Temp (!) 97.5 F (36.4 C) (Temporal)   Ht 5' 3.5" (1.613 m)   Wt 275 lb (124.7 kg)   LMP 06/05/2021 (Approximate)   SpO2 94%   BMI 47.95 kg/m  Wt Readings from Last 3 Encounters:  06/18/21 275 lb (124.7 kg)  05/07/21 277 lb 9.6 oz (125.9 kg)  08/31/20 271 lb 9.6 oz (123.2 kg)     Health Maintenance Due  Topic Date Due   COVID-19 Vaccine (1) Never done   HPV VACCINES (1 - 2-dose series) Never done   OPHTHALMOLOGY EXAM  09/27/2020   FOOT EXAM  03/10/2021   URINE MICROALBUMIN  03/10/2021       Topic Date Due   HPV VACCINES (1 - 2-dose series) Never done    Lab Results  Component Value Date   TSH 2.150 05/07/2021   Lab Results  Component Value Date   WBC 9.3 03/10/2020   HGB 14.5 03/10/2020   HCT 43.3 03/10/2020   MCV 91 03/10/2020   PLT 315 03/10/2020   Lab Results  Component Value Date   NA 139 05/07/2021   K 4.3 05/07/2021   CO2 23 05/07/2021   GLUCOSE 87 05/07/2021   BUN 6 05/07/2021   CREATININE 0.80 05/07/2021   BILITOT 0.3 05/07/2021   ALKPHOS 99 05/07/2021   AST 22 05/07/2021   ALT 29 05/07/2021   PROT 7.1 05/07/2021   ALBUMIN 4.1 05/07/2021   CALCIUM 9.8 05/07/2021   EGFR 106 05/07/2021   Lab Results  Component Value Date   CHOL 276 (H) 05/07/2021   Lab Results  Component Value Date   HDL 39 (L) 05/07/2021   Lab Results  Component Value Date   LDLCALC 205 (H) 05/07/2021   Lab Results  Component Value Date   TRIG 168 (H) 05/07/2021   Lab Results  Component Value Date   CHOLHDL 7.1 (H) 05/07/2021   Lab Results  Component Value Date   HGBA1C 5.8 (A) 05/07/2021      Assessment & Plan:  Leanna was seen today for depression and anxiety.  Diagnoses and all orders for this visit:  Need for immunization against influenza -     Flu Vaccine QUAD 62moIM (Fluarix, Fluzone & Alfiuria Quad PF)  Anxiety and  depression -     buPROPion ([facility]  SR) 150 MG 12 hr tablet; Take 1 tablet (150 mg total) by mouth 2 (two) times daily.  Morbid obesity ([place]) Morbid Obesity is BMI >40 indicating an excess in caloric intake or underlining conditions. This may lead to other co-morbidities. Lifestyle modifications of diet and exercise may reduce obesity.    Other orders -     Liraglutide -Weight Management (SAXENDA) 18 MG/3ML SOPN; Inject 3 mg into the skin daily. 0.6 mg daily for 1 week, 1.2 mg daily for the next week, 1.8 mg daily for next week. If tolerated, can further increase to 2.4 mg daily. Max dose: 3 mg/daily    Meds ordered this encounter  Medications   buPROPion (WELLBUTRIN SR) 150 MG 12 hr tablet    Sig: Take 1 tablet (150 mg total) by mouth 2 (two) times daily.    Dispense:  180 tablet    Refill:  1   Liraglutide -Weight Management (SAXENDA) 18 MG/3ML SOPN    Sig: Inject 3 mg into the skin daily. 0.6 mg daily for 1 week, 1.2 mg daily for the next week, 1.8 mg daily for next week. If tolerated, can further increase to 2.4 mg daily. Max dose: 3 mg/daily    Dispense:  3 mL    Refill:  3    Follow-up: Return in about 3 months (around 09/18/2021) for follow up with luke 6 weeks /PCP 3 months.    Kerin Perna, NP

## 2021-06-18 NOTE — Telephone Encounter (Signed)
Sent to PCP ?

## 2021-06-18 NOTE — Patient Instructions (Signed)
Influenza, Adult °Influenza is also called "the flu." It is an infection in the lungs, nose, and throat (respiratory tract). It spreads easily from person to person (is contagious). The flu causes symptoms that are like a cold, along with high fever and body aches. °What are the causes? °This condition is caused by the influenza virus. You can get the virus by: °Breathing in droplets that are in the air after a person infected with the flu coughed or sneezed. °Touching something that has the virus on it and then touching your mouth, nose, or eyes. °What increases the risk? °Certain things may make you more likely to get the flu. These include: °Not washing your hands often. °Having close contact with many people during cold and flu season. °Touching your mouth, eyes, or nose without first washing your hands. °Not getting a flu shot every year. °You may have a higher risk for the flu, and serious problems, such as a lung infection (pneumonia), if you: °Are older than 65. °Are pregnant. °Have a weakened disease-fighting system (immune system) because of a disease or because you are taking certain medicines. °Have a long-term (chronic) condition, such as: °Heart, kidney, or lung disease. °Diabetes. °Asthma. °Have a liver disorder. °Are very overweight (morbidly obese). °Have anemia. °What are the signs or symptoms? °Symptoms usually begin suddenly and last 4-14 days. They may include: °Fever and chills. °Headaches, body aches, or muscle aches. °Sore throat. °Cough. °Runny or stuffy (congested) nose. °Feeling discomfort in your chest. °Not wanting to eat as much as normal. °Feeling weak or tired. °Feeling dizzy. °Feeling sick to your stomach or throwing up. °How is this treated? °If the flu is found early, you can be treated with antiviral medicine. This can help to reduce how bad the illness is and how long it lasts. This may be given by mouth or through an IV tube. °Taking care of yourself at home can help your  symptoms get better. Your doctor may want you to: °Take over-the-counter medicines. °Drink plenty of fluids. °The flu often goes away on its own. If you have very bad symptoms or other problems, you may be treated in a hospital. °Follow these instructions at home: °  °Activity °Rest as needed. Get plenty of sleep. °Stay home from work or school as told by your doctor. °Do not leave home until you do not have a fever for 24 hours without taking medicine. °Leave home only to go to your doctor. °Eating and drinking °Take an ORS (oral rehydration solution). This is a drink that is sold at pharmacies and stores. °Drink enough fluid to keep your pee pale yellow. °Drink clear fluids in small amounts as you are able. Clear fluids include: °Water. °Ice chips. °Fruit juice mixed with water. °Low-calorie sports drinks. °Eat bland foods that are easy to digest. Eat small amounts as you are able. These foods include: °Bananas. °Applesauce. °Rice. °Lean meats. °Toast. °Crackers. °Do not eat or drink: °Fluids that have a lot of sugar or caffeine. °Alcohol. °Spicy or fatty foods. °General instructions °Take over-the-counter and prescription medicines only as told by your doctor. °Use a cool mist humidifier to add moisture to the air in your home. This can make it easier for you to breathe. °When using a cool mist humidifier, clean it daily. Empty water and replace with clean water. °Cover your mouth and nose when you cough or sneeze. °Wash your hands with soap and water often and for at least 20 seconds. This is also important after   you cough or sneeze. If you cannot use soap and water, use alcohol-based hand sanitizer. °Keep all follow-up visits. °How is this prevented? ° °Get a flu shot every year. You may get the flu shot in late summer, fall, or winter. Ask your doctor when you should get your flu shot. °Avoid contact with people who are sick during fall and winter. This is cold and flu season. °Contact a doctor if: °You get  new symptoms. °You have: °Chest pain. °Watery poop (diarrhea). °A fever. °Your cough gets worse. °You start to have more mucus. °You feel sick to your stomach. °You throw up. °Get help right away if you: °Have shortness of breath. °Have trouble breathing. °Have skin or nails that turn a bluish color. °Have very bad pain or stiffness in your neck. °Get a sudden headache. °Get sudden pain in your face or ear. °Cannot eat or drink without throwing up. °These symptoms may represent a serious problem that is an emergency. Get medical help right away. Call your local emergency services (911 in the [place] °Do not wait to see if the symptoms will go away. °Do not drive yourself to the hospital. °Summary °Influenza is also called "the flu." It is an infection in the lungs, nose, and throat. It spreads easily from person to person. °Take over-the-counter and prescription medicines only as told by your doctor. °Getting a flu shot every year is the best way to not get the flu. °This information is not intended to replace advice given to you by your health care provider. Make sure you discuss any questions you have with your health care provider. °Document Revised: 04/21/2020 Document Reviewed: 04/21/2020 °Elsevier Patient Education © 2022 [employer] ° °

## 2021-06-20 ENCOUNTER — Telehealth: Payer: Self-pay

## 2021-06-20 NOTE — Telephone Encounter (Signed)
Copied from [place] [id number]. Topic: Appointment Scheduling - Scheduling Inquiry for Clinic >> Jun 20, 2021  1:22 PM Elizabeth Howell wrote: Reason for CRM: Pt called in wanting to get 10/06 appt with Ashante Mccoy rescheduled and requested if she give a call back, please advise.

## 2021-06-21 ENCOUNTER — Ambulatory Visit (INDEPENDENT_AMBULATORY_CARE_PROVIDER_SITE_OTHER): Payer: Medicaid Other | Admitting: Clinical

## 2021-06-21 ENCOUNTER — Other Ambulatory Visit: Payer: Self-pay

## 2021-06-21 DIAGNOSIS — F4323 Adjustment disorder with mixed anxiety and depressed mood: Secondary | ICD-10-CM | POA: Diagnosis not present

## 2021-06-21 NOTE — Telephone Encounter (Signed)
Pt came to her appointment today.

## 2021-07-01 NOTE — BH Specialist Note (Signed)
Integrated Behavioral Health Initial In-Person Visit  MRN: [id number] Name: Elizabeth Howell  Number of [facility] Clinician visits:: 1/6 Session Start time: 10:00am  Session End time: 11:00am Total time: 60 minutes  Types of Service: Individual psychotherapy  Interpretor:No. Interpretor Name and Language: N/A   Warm Hand Off Completed.        Subjective: Elizabeth Howell is a 25 y.o. female accompanied by  self Patient was referred by PCP Juluis Mire, NP for depression and anxiety. Patient reports the following symptoms/concerns: Reports feeling depressed, decreased interest in daily activities, trouble sleeping, decreased energy, self-esteem disturbance, anxiousness, worrying, and irritability. Reports a hx of strained relationship with her father. Reports that her father frequently adds to her stress by arguing with her. Reports that she was raised by her grandparents and that her mother passed away in early childhood. Reports that she frequently feels like she is treated like a child at home as she currently stays with her grandparents. Reports that her grandparents constantly question her desire to go out with friends. Reports that she wants to live on her own.  Duration of problem: 6 months; Severity of problem: moderate  Objective: Mood: Anxious and Depressed and Affect: Appropriate Risk of harm to self or others: No plan to harm self or others  Life Context: Family and Social: Reports she currently lives with her grandparents.  School/Work: Pt is employed full-time.  Self-Care: Denies substance use. Reports spending time with friends and listening to music as coping skill. Life Changes: Reports that she has a strained relationship with her father and frequently gets into arguments with him. Reports that she often feels like she gets treated like a child from her grandparents. Reports that she wants to live on her own.   Patient and/or Family's  Strengths/Protective Factors: Social connections  Goals Addressed: Patient will: Reduce symptoms of: anxiety and depression Increase knowledge and/or ability of: coping skills  Demonstrate ability to: Increase healthy adjustment to current life circumstances and Increase adequate support systems for patient/family  Progress towards Goals: Ongoing  Interventions: Interventions utilized: Mindfulness or Psychologist, educational, CBT Cognitive Behavioral Therapy, Supportive Counseling, and Psychoeducation and/or Health Education  Standardized Assessments completed: ASRS, GAD-7, and PHQ 9  Patient and/or Family Response: Pt receptive to tx. Pt receptive to psychoeducation provided on depression and anxiety. Pt receptive to cognitive restructuring to decrease pt worries and to assist with cognitive processing skills. Pt receptive to psychoeducation provided on establishing healthy boundaries. Pt will incorporate deep breathing exercises and begin establishing healthy boundaries with family.  Patient Centered Plan: Patient is on the following Treatment Plan(s):  Depression and anxiety  Assessment: Denies SI/HI. Denies auditory/visual hallucinations. No safety risks. Patient currently experiencing stress at home with her grandparents and a strained relationship with her father. Pt appears to have difficulty with cognitive processing skills. Pt also has difficulty with establishing healthy boundaries and appears to have people pleasing behaviors.   Patient may benefit from brief therapy to assist with current stressors. LCSWA provided psychoeducation on anxiety, depression, and the benefits of establishing healthy boundaries. LCSWA utilized cognitive restructuring to decrease pt worries and assisted with cognitive processing skills. LCSWA encouraged pt to establish healthy boundaries with family and to utilize deep breathing exercises. LCSWA will fu with pt.  Plan: Follow up with behavioral health  clinician on : 07/05/21 Behavioral recommendations: Utilize deep breathing exercises and establish healthy boundaries with family Referral(s): [place] (In Clinic) "From scale of 1-10, how likely are you  to follow plan?": 10  Safiyyah Vasconez C Jaedyn Lard, LCSW

## 2021-07-05 ENCOUNTER — Ambulatory Visit (INDEPENDENT_AMBULATORY_CARE_PROVIDER_SITE_OTHER): Payer: Medicaid Other | Admitting: Clinical

## 2021-07-12 ENCOUNTER — Other Ambulatory Visit: Payer: Self-pay

## 2021-07-12 ENCOUNTER — Ambulatory Visit (INDEPENDENT_AMBULATORY_CARE_PROVIDER_SITE_OTHER): Payer: Medicaid Other | Admitting: Clinical

## 2021-07-12 DIAGNOSIS — F4323 Adjustment disorder with mixed anxiety and depressed mood: Secondary | ICD-10-CM

## 2021-07-13 NOTE — BH Specialist Note (Signed)
Integrated Behavioral Health Follow Up In-Person Visit  MRN: [id number] Name: Elizabeth Howell  Number of [facility] Clinician visits: 2/6 Session Start time: 2:40pm  Session End time: 3:20pm Total time: 40  minutes  Types of Service: Individual psychotherapy  Interpretor:No. Interpretor Name and Language: N/A  Subjective: Elizabeth Howell is a 25 y.o. female accompanied by  self Patient was referred by Juluis Mire, NP for depression and anxiety. Patient reports the following symptoms/concerns: Reports decreased interest in activities, feeling depressed at times, trouble sleeping, self-esteem disturbances, trouble concentrating, fidgeting, worrying, restlessness, and irritability. Reports difficulty with her relationship with her father. Reports that her father is frequently inconsistent in her life and she often feels triggered. Reports that she continues to live with her grandparents and wants to live on her own but knows that he is not financially ready.  Duration of problem: 6 months; Severity of problem: moderate  Objective: Mood: Anxious and Depressed and Affect: Appropriate Risk of harm to self or others: No plan to harm self or others  Life Context: Family and Social: Reports she currently lives with her grandparents.  School/Work: Pt is employed full-time.  Self-Care: Denies substance use. Reports spending time with friends, looking at tiktok, and listening to music as coping skill. Life Changes: Reports that she has a strained relationship with her father and frequently gets into arguments with him. Reports that she often feels like she gets treated like a child from her grandparents. Reports that she wants to live on her own but does not feel financially ready.  Patient and/or Family's Strengths/Protective Factors: Social connections  Goals Addressed: Patient will:  Reduce symptoms of: anxiety and depression   Increase knowledge and/or ability  of: coping skills   Demonstrate ability to: Increase healthy adjustment to current life circumstances and Increase adequate support systems for patient/family  Progress towards Goals: Ongoing  Interventions: Interventions utilized:  Mindfulness or Psychologist, educational, CBT Cognitive Behavioral Therapy, Supportive Counseling, and Psychoeducation and/or Health Education Standardized Assessments completed: GAD-7 and PHQ 9 GAD 7 : Generalized Anxiety Score 07/12/2021 06/21/2021 06/18/2021 05/07/2021  Nervous, Anxious, on Edge 0 1 0 0  Control/stop worrying 0 0 0 0  Worry too much - different things 2 2 1 1   Trouble relaxing 0 0 0 0  Restless 2 1 0 0  Easily annoyed or irritable 3 3 3 3   Afraid - awful might happen 0 0 0 0  Total GAD 7 Score 7 7 4 4   Anxiety Difficulty - - Not difficult at all Not difficult at all     Depression screen [facility] 2/9 07/12/2021 06/21/2021 06/18/2021 05/07/2021 03/10/2020  Decreased Interest 3 3 3 3  0  Down, Depressed, Hopeless 1 2 2 2 1   PHQ - 2 Score 4 5 5 5 1   Altered sleeping 3 3 3 3  0  Tired, decreased energy 0 1 1 1  0  Change in appetite 1 0 1 1 1   Feeling bad or failure about yourself  2 1 3 1 1   Trouble concentrating 3 2 0 0 0  Moving slowly or fidgety/restless 1 0 0 0 0  Suicidal thoughts 0 0 0 0 0  PHQ-9 Score 14 12 13 11 3   Difficult doing work/chores - - Somewhat difficult Somewhat difficult -  Some recent data might be hidden    Patient and/or Family Response: Pt receptive to tx. Pt receptive to psychoeducation provided on trauma, depression, and anxiety. Pt receptive to support provided as pt discussed her relationship  with her father. Pt receptive to cogntive restructuring utilized to decrease negative thoughts and assist with cognitive processing skills. Pt receptive to utilizing deep breathing exercises and meditation and establishing healthy boundaries with family.  Patient Centered Plan: Patient is on the following Treatment Plan(s): Depression  and anxiety  Assessment: Denies SI/HI. Denies auditory/visual hallucinations. No safety risk. Patient currently experiencing depression and anxiety related to relationship with father and stress at home with her grandparents. Pt appears to have experienced trauma with her father as he has been inconsistent throughout her life. Pt acknowledges feeling "triggered" at times when talking to her father. Pt has difficulty with cognitive processing skills.   Patient may benefit from utilizing an automatic thought record. LCSWA provided psychoeducation on trauma, depression, and anxiety. LCSWA utilized cognitive restructuring to decrease negative thoughts and assist with cognitive processing skills. LCSWA encouraged pt to utilize deep breathing exercises, meditation, and establish healthy boundaries with family. Pt also expressed that she found tik tok videos that express her feelings that she wanted to share with LCSWA and discuss in next session. LCSWA will fu with pt.   Plan: Follow up with behavioral health clinician on : 07/26/21 Behavioral recommendations: Utilize deep breathing exercises, meditation, and establish healthy boundaries with family. Referral(s): [place] (In Clinic) "From scale of 1-10, how likely are you to follow plan?": 10  Bettie Capistran C Jazari Ober, LCSW

## 2021-07-26 ENCOUNTER — Other Ambulatory Visit: Payer: Self-pay

## 2021-07-26 ENCOUNTER — Ambulatory Visit (INDEPENDENT_AMBULATORY_CARE_PROVIDER_SITE_OTHER): Payer: Medicaid Other | Admitting: Clinical

## 2021-07-26 DIAGNOSIS — F331 Major depressive disorder, recurrent, moderate: Secondary | ICD-10-CM

## 2021-07-27 NOTE — BH Specialist Note (Signed)
Integrated Behavioral Health Follow Up In-Person Visit  MRN: [id number] Name: Elizabeth Howell  Number of [place] Clinician visits: 3/6 Session Start time: 2:45pm  Session End time: 3:30pm Total time: 45  minutes  Types of Service: Individual psychotherapy  Interpretor:No. Interpretor Name and Language: N/A  Subjective: Elizabeth Howell is a 25 y.o. female accompanied by  self Patient was referred by PCP Juluis Mire, NP for depression and anxiety. Patient reports the following symptoms/concerns: Reports feeling depressed, trouble sleeping at times, self-esteem disturbances, trouble concentrating, worrying, and irritability. Reports that her grandparents make her feel bad about herself at times. Reports that her grandfather often asks her about her priorities. Reports worrying about her future and needing a better job due to limited hours at her current one. Pt also reports a hx of bullying during childhood. Duration of problem: 7 months; Severity of problem: moderate  Objective: Mood: Anxious and Depressed and Affect: Appropriate Risk of harm to self or others: No plan to harm self or others  Life Context: Family and Social: Reports she currently lives with her grandparents.  School/Work: Pt is employed but has recently lost hours at work.  Self-Care: Denies substance use. Reports spending time with friends, looking at tiktok, and listening to music as coping skill. Life Changes: Reports that she has a strained relationship with her father and frequently gets into arguments with him. Reports that she often feels like she gets treated like a child from her grandparents. Reports that she wants to live on her own but does not feel financially ready.  Patient and/or Family's Strengths/Protective Factors: Social connections  Goals Addressed: Patient will:  Reduce symptoms of: anxiety and depression   Increase knowledge and/or ability of: coping skills    Demonstrate ability to: Increase healthy adjustment to current life circumstances and Increase adequate support systems for patient/family  Progress towards Goals: Ongoing  Interventions: Interventions utilized:  CBT Cognitive Behavioral Therapy, Supportive Counseling, and psychodynamic therapy Standardized Assessments completed: Not Needed  Patient and/or Family Response: Pt receptive to tx. Pt receptive to cognitive restructuring to decrease negative and unhelpful thoughts. Pt receptive to brief psychodynamic approach in order to identify patterns and pt's goals. Pt will begin identifying enjoyable activities and goals for her future.  Patient Centered Plan: Patient is on the following Treatment Plan(s): Depression and anxiety  Assessment: Denies SI/HI. Denies auditory/visual hallucinations. No safety risks. Patient currently experiencing depression and anxiety. Pt appears to have an impacted relationship with her grandparents as she doesn't feel like she's treated like an adult. Pt is having difficulty with finances and is trying to find a different job. Pt appears to experience self-esteem problems as she frequently compares herself to others.   Patient may benefit from setting goals for her future career. LCSWA utilized cognitive restructuring to decrease negative and unhelpful thoughts. LCSWA assisted pt to in identifying a pattern in her behavior with comparing herself. LCSWA encouraged pt to begin identifying enjoyable activities in order to assist with goal setting. LCSWA will fu with pt.  Plan: Follow up with behavioral health clinician on : 12/1/222 Behavioral recommendations: Identify enjoyable activities to assist with goal setting and utilize deep breathing exercises Referral(s): [facility] (In Clinic) "From scale of 1-10, how likely are you to follow plan?": 10  Ivalene Platte C Marykay Mccleod, LCSW

## 2021-08-16 ENCOUNTER — Ambulatory Visit (INDEPENDENT_AMBULATORY_CARE_PROVIDER_SITE_OTHER): Payer: Medicaid Other | Admitting: Clinical

## 2021-08-16 ENCOUNTER — Other Ambulatory Visit: Payer: Self-pay

## 2021-08-16 DIAGNOSIS — F331 Major depressive disorder, recurrent, moderate: Secondary | ICD-10-CM | POA: Diagnosis not present

## 2021-08-17 NOTE — BH Specialist Note (Signed)
Integrated Behavioral Health Follow Up In-Person Visit  MRN: [id number] Name: Elizabeth Howell  Number of [place] Clinician visits: 4/6 Session Start time: 2:30pm Session End time: 3:15pm Total time: 45  minutes  Types of Service: Individual psychotherapy  Interpretor:No. Interpretor Name and Language: N/A  Subjective: Cheria Sadiq is a 25 y.o. female accompanied by  self Patient was referred by Juluis Mire, NP for depression and anxiety. Patient reports the following symptoms/concerns: Reports feeling depressed, trouble sleeping, self-esteem disturbances, trouble concentrating, worrying, and irritability. Reports continued problems at home with her grandparents. Reports that she recently spoke to her father and felt manipulated when talking to him.  Duration of problem: 7 months; Severity of problem: moderate  Objective: Mood: Anxious and Affect: Appropriate Risk of harm to self or others: No plan to harm self or others  Life Context: Family and Social: Reports she currently lives with her grandparents.  School/Work: Pt is employed but has recently lost hours at work. Pt is searching for another job. Self-Care: Denies substance use. Reports spending time with friends, looking at tiktok, and listening to music as coping skill. Life Changes: Reports that she has a strained relationship with her father and frequently gets into arguments with him. Reports that she often feels like she gets treated like a child from her grandparents. Reports that she wants to live on her own but does not feel financially ready.  Patient and/or Family's Strengths/Protective Factors: Social connections  Goals Addressed: Patient will:  Reduce symptoms of: anxiety and depression   Increase knowledge and/or ability of: coping skills   Demonstrate ability to: Increase adequate support systems for patient/family  Progress towards  Goals: Ongoing  Interventions: Interventions utilized:  CBT Cognitive Behavioral Therapy, Supportive Counseling, and Brief Psychodynamic Therapy Standardized Assessments completed: Not Needed  Patient and/or Family Response: Pt receptive to tx. Pt receptive to cognitive restructuring to decrease negative and unhelpful thoughts. Pt receptive identifying a pattern in behavior. Pt will continue healthy coping skills and identifying enjoyable activities and goals for her future.  Patient Centered Plan: Patient is on the following Treatment Plan(s): Depression and anxiety  Assessment: Denies SI/HI. Denies auditory/visual hallucinations. Patient currently experiencing depression and anxiety. Pt experiences familial problems. Pt was able to identify a pattern with people pleasing behavior as it related to continuing to talk on the phone to her father despite her not wanting to in the moment. Pt continues to experience problems with self-esteem.   Patient may benefit from continued brief interventions. LCSWA utilized cognitive restructuring to decrease negative and unhelpful thoughts. LCSWA assisted pt in identifying a maladaptive pattern. LCSWA encouraged pt to continue healthy coping skills (listening to music, deep breathing exercises) and identifying enjoyable activities for her future. LCSWA will fu with pt.  Plan: Follow up with behavioral health clinician on : 08/30/21 Behavioral recommendations: Continue healthy coping skills and identifying enjoyable activities to assist with future goals. Referral(s): [place] (In Clinic) "From scale of 1-10, how likely are you to follow plan?": 10  Lawonda Pretlow C Lanessa Shill, LCSW

## 2021-08-21 ENCOUNTER — Other Ambulatory Visit: Payer: Self-pay

## 2021-08-21 ENCOUNTER — Encounter: Payer: Self-pay | Admitting: Allergy & Immunology

## 2021-08-21 ENCOUNTER — Ambulatory Visit (INDEPENDENT_AMBULATORY_CARE_PROVIDER_SITE_OTHER): Payer: Medicaid Other | Admitting: Allergy & Immunology

## 2021-08-21 VITALS — BP 100/60 | HR 103 | Temp 98.2°F | Resp 28 | Ht 62.0 in | Wt 280.0 lb

## 2021-08-21 DIAGNOSIS — T7840XD Allergy, unspecified, subsequent encounter: Secondary | ICD-10-CM | POA: Diagnosis not present

## 2021-08-21 DIAGNOSIS — R21 Rash and other nonspecific skin eruption: Secondary | ICD-10-CM

## 2021-08-21 DIAGNOSIS — T7840XA Allergy, unspecified, initial encounter: Secondary | ICD-10-CM

## 2021-08-21 MED ORDER — TRIAMCINOLONE ACETONIDE 0.1 % EX OINT: 1.0000 "application " | TOPICAL_OINTMENT | Freq: Two times a day (BID) | CUTANEOUS | 5 refills | Status: DC

## 2021-08-21 NOTE — Patient Instructions (Addendum)
1. Allergic reaction - Testing was negative to the environmental testing as well as the most common foods plus avocado. - Copy of testing results provided. - There is a the low positive predictive value of food allergy testing and hence the high possibility of false positives. - In contrast, food allergy testing has a high negative predictive value, therefore if testing is negative we can be relatively assured that they are indeed negative.  - I do not think that you need to avoid the foods to which you had testing today (since it was all negative). - We are going to get some labs to rule out serious causes of anaphylaxis and allergic reactions. - Note any future triggers of your reactions. - Anaphylaxis management plan provided. - EpiPen training reviewed.  2. Rash - Your skin is very reactive. - Start triamcinolone 0.1 ointment twice daily to the hyperpigmented lesions on the arm to see if this helps at all. - I am not sure what to make of this.  3. Return in about 3 months (around 11/19/2021).    Please inform us of any Emergency Department visits, hospitalizations, or changes in symptoms. Call us before going to the ED for breathing or allergy symptoms since we might be able to fit you in for a sick visit. Feel free to contact us anytime with any questions, problems, or concerns.  It was a pleasure to meet you today!  Websites that have reliable patient information: 1. American Academy of Asthma, Allergy, and Immunology: www.aaaai.org 2. Food Allergy Research and Education (FARE): foodallergy.org 3. Mothers of Asthmatics: http://www.asthmacommunitynetwork.org 4. American College of Allergy, Asthma, and Immunology: www.acaai.org   COVID-19 Vaccine Information can be found at: [email or web address].uk For questions related to vaccine distribution or appointments, please email [email or web address] or call [phone].   We realize  that you might be concerned about having an allergic reaction to the COVID19 vaccines. To help with that concern, WE ARE OFFERING THE COVID19 VACCINES IN OUR OFFICE! Ask the front desk for dates!     "Like" [place] on Facebook and Instagram for our latest updates!      A healthy democracy works best when [employer] participate! Make sure you are registered to vote! If you have moved or changed any of your contact information, you will need to get this updated before voting!  In some cases, you MAY be able to register to vote online: [email or web address]     Airborne Adult Perc - 08/21/21 1043     Time Antigen Placed [place] [facility]    Location Back    Number of Test 59    Panel 1 Select    1. Control-Buffer 50% Glycerol Negative    2. Control-Histamine 1 mg/ml 2+    3. Albumin saline Negative    4. Scenic Negative    5. [place] Negative    6. [place] Negative    7. [place] Blue Negative    8. Meadow Fescue Negative    9. Perennial Rye Negative    10. Sweet Vernal Negative    11. Timothy Negative    12. Cocklebur Negative    13. Burweed Marshelder Negative    14. Ragweed, short Negative    15. Ragweed, Giant Negative    16. Plantain,  English Negative    17. Lamb's Quarters Negative    18. Sheep Sorrell Negative    19. Rough Pigweed Negative    20. Marsh Elder, Rough Negative    21. Mugwort,  Common Negative    22. Ash mix Negative    23. Birch mix Negative    24. Beech American Negative    25. Box, Elder Negative    26. Cedar, red Negative    27. Cottonwood, [place] Negative    28. Elm mix Negative    29. Hickory Negative    30. Maple mix Negative    31. Oak, [place] mix Negative    32. Pecan Pollen Negative    33. Pine mix Negative    34. Sycamore Eastern Negative    35. Pioneer, Black Pollen Negative    36. Alternaria alternata Negative    37. Cladosporium Herbarum Negative    38. Aspergillus mix Negative    39.  Penicillium mix Negative    40. Bipolaris sorokiniana (Helminthosporium) Negative    41. Drechslera spicifera (Curvularia) Negative    42. Mucor plumbeus Negative    43. Fusarium moniliforme Negative    44. Aureobasidium pullulans (pullulara) Negative    45. Rhizopus oryzae Negative    46. Botrytis cinera Negative    47. Epicoccum nigrum Negative    48. Phoma betae Negative    49. Candida Albicans Negative    50. Trichophyton mentagrophytes Negative    51. Mite, D Farinae  5,000 AU/ml Negative    52. Mite, D Pteronyssinus  5,000 AU/ml Negative    53. Cat Hair 10,000 BAU/ml Negative    54.  Dog Epithelia Negative    55. Mixed Feathers Negative    56. Horse Epithelia Negative    57. Cockroach, German Negative    58. Mouse Negative    59. Tobacco Leaf Negative             Food Adult Perc - 08/21/21 1000     Time Antigen Placed [place]    Location Back    Number of allergen test 20    1. Peanut Negative    2. Soybean Negative    3. Wheat Negative    4. Sesame Negative    5. Milk, cow Negative    6. Egg White, Chicken Negative    7. Casein Negative    8. Shellfish Mix Negative    9. Fish Mix Negative    10. Cashew Negative    11. Pecan Food Negative    12. [place] Negative    13. Almond Negative    14. Hazelnut Negative    15. [place] nut Negative    16. Coconut Negative    17. Pistachio Negative    22. Salmon Negative    42. Tomato Negative    48. Avocado Negative

## 2021-08-21 NOTE — Progress Notes (Signed)
NEW PATIENT  Date of Service/Encounter:  08/21/21  Consult requested by: Elizabeth Perna, NP   Assessment:   Allergic reaction - unknown trigger but skin testing unrevealing today  Rash - unknown trigger  History of asthma as a child  Family of [place]   Plan/Recommendations:   1. Allergic reaction - Testing was negative to the environmental testing as well as the most common foods plus avocado. - Copy of testing results provided. - There is a the low positive predictive value of food allergy testing and hence the high possibility of false positives. - In contrast, food allergy testing has a high negative predictive value, therefore if testing is negative we can be relatively assured that they are indeed negative.  - I do not think that you need to avoid the foods to which you had testing today (since it was all negative). - We are going to get some labs to rule out serious causes of anaphylaxis and allergic reactions. - Note any future triggers of your reactions. - Anaphylaxis management plan provided. - EpiPen training reviewed.  2. Rash - Your skin is very reactive. - Start triamcinolone 0.1 ointment twice daily to the hyperpigmented lesions on the arm to see if this helps at all. - I am not sure what to make of this.  3. Return in about 3 months (around 11/19/2021).   This note in its entirety was forwarded to the Provider who requested this consultation.  Subjective:   Elizabeth Howell is a 25 y.o. female presenting today for evaluation of  Chief Complaint  Patient presents with   Allergy Testing   Allergic Reaction    September had bad itching and fae swelling after brunch (avocado toast: avocados, white bread, smoke salmon, tomatoes, vinaigrette, mimosas, cinnamon toast crunch milk shake    Elizabeth Howell has a history of the following: Patient Active Problem List   Diagnosis Date Noted   Anxiety and depression 01/14/2017   Dyslipidemia  11/19/2016   Morbid obesity ([place]) 11/25/2014   Insulin resistance 11/25/2014   Hyperinsulinemia 11/25/2014   Essential hypertension, benign 11/25/2014   Acanthosis nigricans, acquired 11/25/2014   Dyspepsia 11/25/2014   Goiter 11/25/2014   Thyroiditis, autoimmune 11/25/2014   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  Elizabeth Howell was referred by Elizabeth Perna, NP.     Elizabeth Howell is a 25 y.o. female presenting for an evaluation of an allergic reaction .  She reports that she went to [employer]. She ate avocado toast and she had tolerated all of the ingredients in the past. This was on September 11st. Her symptoms did not start until 12am that night. This was around 12 hours after brunch. She cannot remember what she had for dinner that night. Around one hour after brunch, they got milkshakes at a stall after walking around downtown. But she keeps coming back to the avocado toast. She was not eating a lot at that point because she was having an allergic reaction. She was leery of everything.   She was itching very badly. She got some hydrocortisone and she went back to sleep. Then she had a swollen face over her entire face. It was itching as well. This was mostly shoulders and face. She went to Urgent Care and got a steroid shot and a prednisone taper. She took it and it helped, but then she developed rebound swelling after she  completed her prednisone taper.   She went back to the ED and got a prescription for an EpiPen. She did get Benadryl.    Asthma/Respiratory Symptom History: She did have asthma as a child. She has not had an inhaler in years.  She denies nighttime coughing.  She has not needed prednisone for her breathing.  Allergic Rhinitis Symptom History: She denies any allergic rhinitis symptoms.  She was on Benadryl after the reaction for a few weeks. Otherwise she  did not take Benadryl much at all. She cannot remember the last time that she had antibiotics at all.   Skin Symptom History: She does have some scarring over her arms. She reports that she picks at her skin and her skin is highly reactive. She has six dogs at home.  She does not seem bothered by this.  She got her first tattoo in May 2022. It healed well. But this seems to be trigger than made the rest of her skin more sensitive.  It does not seem to be triggered by any particular food.  She does not have a topical steroid to use as needed.  She works at [employer] right now. She is only doing two days per week and has been there for one year. She was working at the [employer] since 2019. She lives with her grandmother and grandfather as well as her younger brother. Her Mom passed away when she was 9. Her father has never been involved in her life.   Otherwise, there is no history of other atopic diseases, including asthma, drug allergies, stinging insect allergies, or contact dermatitis. There is no significant infectious history. Vaccinations are up to date.    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 If you have any questions, ask your nurse or doctor.          atorvastatin 40 MG tablet Commonly known as: LIPITOR Take 1 tablet (40 mg total) by mouth daily.   buPROPion 150 MG 12 hr  tablet Commonly known as: Wellbutrin SR Take 1 tablet (150 mg total) by mouth 2 (two) times daily.   diphenhydrAMINE 25 MG tablet Commonly known as: BENADRYL Take 1 tablet (25 mg total) by mouth every 6 (six) hours as needed.   EPINEPHrine 0.3 mg/0.3 mL Soaj injection Commonly known as: EPI-PEN Inject 0.3 mg into the muscle as needed for anaphylaxis.   famotidine 20 MG tablet Commonly known as: PEPCID Take 1 tablet (20 mg total) by mouth daily for 5 days.   Saxenda 18 MG/3ML Sopn Generic drug: Liraglutide -Weight Management Inject 3 mg into the skin daily. 0.6 mg daily for 1 week, 1.2 mg daily for the next week, 1.8 mg daily for next week. If tolerated, can further increase to 2.4 mg daily. Max dose: 3 mg/daily   triamcinolone ointment 0.1 % Commonly known as: KENALOG Apply 1 application topically 2 (  two) times daily. Started by: Valentina Shaggy, MD        Birth History: non-contributory  Developmental History: non-contributory  Past Surgical History: Past Surgical History:  Procedure Laterality Date   HYDRADENITIS EXCISION Left 07/30/2017   Procedure: EXCISION LEFT AXILLA HIDRADENITIS;  Surgeon: Erroll Luna, MD;  Location: [facility];  Service: General;  Laterality: Left;   WISDOM TOOTH EXTRACTION       Family History: Family History  Problem Relation Age of Onset   Hypertension Maternal Grandfather    Diabetes Other      Social History: Shaylie lives at home with her brother, maternal grandfather, and maternal step grandmother.  Her mother died when she was 81.  Her maternal grandmother died when her mother was very young.  He has been a house that is 23 years old.  There is carpeting throughout the home.  She has electric heating and heat pump for cooling.  There are dogs inside the home.  There are no dust mite covers on the pillows, but she does have them on the bed.  There is no tobacco exposure.  She currently works at [employer].   There is exposure to fumes, chemicals, and dust.  There is no HEPA filter in the home.  They do not live near an interstate or industrial area.   Review of Systems  Constitutional: Negative.  Negative for chills, fever, malaise/fatigue and weight loss.  HENT: Negative.  Negative for congestion, ear discharge, ear pain and sinus pain.   Eyes:  Negative for pain, discharge and redness.  Respiratory:  Negative for cough, sputum production, shortness of breath and wheezing.   Cardiovascular: Negative.  Negative for chest pain and palpitations.  Gastrointestinal:  Negative for abdominal pain, constipation, diarrhea, heartburn, nausea and vomiting.  Skin:  Positive for itching and rash.  Neurological:  Negative for dizziness and headaches.  Endo/Heme/Allergies:  Negative for environmental allergies. Does not bruise/bleed easily.      Objective:   Blood pressure 100/60, pulse (!) 103, temperature 98.2 F (36.8 C), temperature source Temporal, resp. rate (!) 28, height 5\' 2"  (1.575 m), weight 280 lb (127 kg), SpO2 98 %. Body mass index is 51.21 kg/m.   Physical Exam:   Physical Exam Vitals reviewed.  Constitutional:      Appearance: She is well-developed. She is obese.  HENT:     Head: Normocephalic and atraumatic.     Right Ear: Tympanic membrane, ear canal and external ear normal. No drainage, swelling or tenderness. Tympanic membrane is not injected, scarred, erythematous, retracted or bulging.     Left Ear: Tympanic membrane, ear canal and external ear normal. No drainage, swelling or tenderness. Tympanic membrane is not injected, scarred, erythematous, retracted or bulging.     Nose: No nasal deformity, septal deviation, mucosal edema or rhinorrhea.     Right Turbinates: Enlarged and swollen.     Left Turbinates: Enlarged and swollen.     Right Sinus: No maxillary sinus tenderness or frontal sinus tenderness.     Left Sinus: No maxillary sinus tenderness or frontal sinus  tenderness.     Mouth/Throat:     Mouth: Mucous membranes are not pale and not dry.     Pharynx: Uvula midline.  Eyes:     General:        Right eye: No discharge.        Left eye: No discharge.     Conjunctiva/sclera: Conjunctivae normal.     Right eye:  Right conjunctiva is not injected. No chemosis.    Left eye: Left conjunctiva is not injected. No chemosis.    Pupils: Pupils are equal, round, and reactive to light.  Cardiovascular:     Rate and Rhythm: Normal rate and regular rhythm.     Heart sounds: Normal heart sounds.  Pulmonary:     Effort: Pulmonary effort is normal. No tachypnea, accessory muscle usage or respiratory distress.     Breath sounds: Normal breath sounds. No wheezing, rhonchi or rales.     Comments: Moving air well in all lung fields.  No increased work of breathing. Chest:     Chest wall: No tenderness.  Abdominal:     Tenderness: There is no abdominal tenderness. There is no guarding or rebound.  Lymphadenopathy:     Head:     Right side of head: No submandibular, tonsillar or occipital adenopathy.     Left side of head: No submandibular, tonsillar or occipital adenopathy.     Cervical: No cervical adenopathy.  Skin:    General: Skin is warm.     Capillary Refill: Capillary refill takes less than 2 seconds.     Coloration: Skin is not pale.     Findings: No abrasion, erythema, petechiae or rash. Rash is not papular, urticarial or vesicular.     Comments: No eczematous or urticarial lesions noted.  She does have what appear to be hyperpigmented lesions on her bilateral arms.  They are somewhat circular in appearance.  There are some excoriations present.  Neurological:     Mental Status: She is alert.     Diagnostic studies:   Allergy Studies:     Airborne Adult Perc - 08/21/21 1043     Time Antigen Placed 1038    Allergen Manufacturer Lavella Hammock    Location Back    Number of Test 59    Panel 1 Select    1. Control-Buffer 50% Glycerol Negative     2. Control-Histamine 1 mg/ml 2+    3. Albumin saline Negative    4. Clayton Negative    5. [place] Negative    6. Johnson Negative    7. [place] Blue Negative    8. Meadow Fescue Negative    9. Perennial Rye Negative    10. Sweet Vernal Negative    11. Timothy Negative    12. Cocklebur Negative    13. Burweed Marshelder Negative    14. Ragweed, short Negative    15. Ragweed, Giant Negative    16. Plantain,  English Negative    17. Lamb's Quarters Negative    18. Sheep Sorrell Negative    19. Rough Pigweed Negative    20. Marsh Elder, Rough Negative    21. Mugwort, Common Negative    22. Ash mix Negative    23. Birch mix Negative    24. Beech American Negative    25. Box, Elder Negative    26. Cedar, red Negative    27. Cottonwood, [place] Negative    28. Elm mix Negative    29. Hickory Negative    30. Maple mix Negative    31. Oak, [place] mix Negative    32. Pecan Pollen Negative    33. Pine mix Negative    34. Sycamore Eastern Negative    35. Pisek, Black Pollen Negative    36. Alternaria alternata Negative    37. Cladosporium Herbarum Negative    38. Aspergillus mix Negative    39. Penicillium mix Negative  40. Bipolaris sorokiniana (Helminthosporium) Negative    41. Drechslera spicifera (Curvularia) Negative    42. Mucor plumbeus Negative    43. Fusarium moniliforme Negative    44. Aureobasidium pullulans (pullulara) Negative    45. Rhizopus oryzae Negative    46. Botrytis cinera Negative    47. Epicoccum nigrum Negative    48. Phoma betae Negative    49. Candida Albicans Negative    50. Trichophyton mentagrophytes Negative    51. Mite, D Farinae  5,000 AU/ml Negative    52. Mite, D Pteronyssinus  5,000 AU/ml Negative    53. Cat Hair 10,000 BAU/ml Negative    54.  Dog Epithelia Negative    55. Mixed Feathers Negative    56. Horse Epithelia Negative    57. Cockroach, German Negative    58. Mouse Negative    59. Tobacco Leaf Negative              Food Adult Perc - 08/21/21 1000     Time Antigen Placed [place]    Location Back    Number of allergen test 20    1. Peanut Negative    2. Soybean Negative    3. Wheat Negative    4. Sesame Negative    5. Milk, cow Negative    6. Egg White, Chicken Negative    7. Casein Negative    8. Shellfish Mix Negative    9. Fish Mix Negative    10. Cashew Negative    11. Pecan Food Negative    12. [place] Negative    13. Almond Negative    14. Hazelnut Negative    15. [place] nut Negative    16. Coconut Negative    17. Pistachio Negative    22. Salmon Negative    42. Tomato Negative    48. Avocado Negative             Allergy testing results were read and interpreted by myself, documented by clinical staff.          Salvatore Marvel, MD [facility]

## 2021-08-24 LAB — ALLERGEN STINGING INSECT PANEL
Honeybee IgE: <0.1 kU/L
Hornet, White Face, IgE: <0.1 kU/L
Hornet, Yellow, IgE: <0.1 kU/L
Paper Wasp IgE: 0.59 kU/L — AB
Yellow Jacket, IgE: 1.06 kU/L — AB

## 2021-08-24 LAB — ALPHA-GAL PANEL
Allergen Lamb IgE: <0.1 kU/L
Beef IgE: <0.1 kU/L
IgE (Immunoglobulin E), Serum: 606 IU/mL — ABNORMAL HIGH (ref 6–495)
O215-IgE Alpha-Gal: <0.1 kU/L
Pork IgE: <0.1 kU/L

## 2021-08-24 LAB — TRYPTASE: Tryptase: 3.3 ug/L (ref 2.2–13.2)

## 2021-08-30 ENCOUNTER — Other Ambulatory Visit: Payer: Self-pay

## 2021-08-30 ENCOUNTER — Ambulatory Visit (INDEPENDENT_AMBULATORY_CARE_PROVIDER_SITE_OTHER): Payer: Medicaid Other | Admitting: Clinical

## 2021-08-30 DIAGNOSIS — F331 Major depressive disorder, recurrent, moderate: Secondary | ICD-10-CM | POA: Diagnosis not present

## 2021-09-14 NOTE — BH Specialist Note (Signed)
Integrated Behavioral Health Follow Up In-Person Visit  MRN: [id number] Name: Elizabeth Howell  Number of Elizabeth Howell Clinician visits: 5/6 Session Start time: 2:35pm  Session End time: 3:15pm Total time: 40  minutes  Types of Service: Individual psychotherapy  Interpretor:No. Interpretor Name and Language: N/A  Subjective: Elizabeth Howell is a 25 y.o. female accompanied by  self Patient was referred by Elizabeth Mire, NP for depression and anxiety. Patient reports the following symptoms/concerns: Reports worrying and feeling down at times. Reports that she continues to feel pressure from her grandparents about what to do with her future. Reports continued problems with her father. Duration of problem: 7 months; Severity of problem: moderate  Objective: Mood: Anxious and Affect: Appropriate Risk of harm to self or others: No plan to harm self or others  Life Context: Family and Social: Reports she currently lives with her grandparents.  School/Work: Pt is employed but has recently lost hours at work. Pt is searching for another job. Self-Care: Denies substance use. Reports spending time with friends, looking at tiktok, and listening to music as coping skill. Life Changes: Reports that she has a strained relationship with her father and frequently gets into arguments with him. Reports that she often feels like she gets treated like a child from her grandparents. Reports that she wants to live on her own but does not feel financially ready. (No changes to life context)  Patient and/or Family's Strengths/Protective Factors: Social connections  Goals Addressed: Patient will:  Reduce symptoms of: anxiety and depression   Increase knowledge and/or ability of: coping skills   Demonstrate ability to: Increase adequate support systems for patient/family  Progress towards Goals: Ongoing  Interventions: Interventions utilized:  CBT Cognitive Behavioral Therapy  and Supportive Counseling Standardized Assessments completed: Not Needed  Patient and/or Family Response: Pt receptive to tx. Pt receptive to cognitive restructuring to decrease negative thoughts and improve processing with her family problems. Pt receptive to continue establishing healthy boundaries. Pt will continue healthy coping skills with spending time with friends, video games, and deep breathing, and identifying enjoyable activities for future goals.  Patient Centered Plan: Patient is on the following Treatment Plan(s): Depression and anxiety  Assessment: Denies SI/HI. Denies auditory/visual hallucinations. Patient currently experiencing depression and anxiety with family problems. Pt continues process a pattern with people pleasing behaviors within her family. Pt appears to be improving her boundaries with family. Pt continues to feel pressured from her grandparents.   Patient may benefit from continued brief interventions. LCSWA utilized cognitive restructuring to decrease negative thoughts and improve processing with her family problems. LCSWA encouraged pt to continue healthy coping skills and identifying enjoyable activities for her future. LCSWA will fu with pt..  Plan: Follow up with behavioral health clinician on : 09/20/21 Behavioral recommendations: Continue healthy coping skills, establishing healthy boundaries with family, and identifying enjoyable activities for future goals. Referral(s): [place] [place] (In Clinic) "From scale of 1-10, how likely are you to follow plan?": 10  Elizabeth Rutt C Kelson Queenan, LCSW

## 2021-09-20 ENCOUNTER — Ambulatory Visit (INDEPENDENT_AMBULATORY_CARE_PROVIDER_SITE_OTHER): Payer: Medicaid Other | Admitting: Clinical

## 2021-10-04 ENCOUNTER — Other Ambulatory Visit: Payer: Self-pay

## 2021-10-04 ENCOUNTER — Ambulatory Visit (INDEPENDENT_AMBULATORY_CARE_PROVIDER_SITE_OTHER): Payer: Medicaid Other | Admitting: Clinical

## 2021-10-04 DIAGNOSIS — F331 Major depressive disorder, recurrent, moderate: Secondary | ICD-10-CM | POA: Diagnosis not present
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
## 2021-10-25 ENCOUNTER — Other Ambulatory Visit: Payer: Self-pay

## 2021-10-25 ENCOUNTER — Ambulatory Visit (INDEPENDENT_AMBULATORY_CARE_PROVIDER_SITE_OTHER): Payer: Medicaid Other | Admitting: Clinical

## 2021-10-25 DIAGNOSIS — F331 Major depressive disorder, recurrent, moderate: Secondary | ICD-10-CM

## 2021-10-26 NOTE — BH Specialist Note (Signed)
Integrated Behavioral Health Follow Up In-Person Visit  MRN: [id number] Name: Elizabeth Howell  Number of [facility] Clinician visits: Additional Visit  Session Start time: 1010   Session End time: 2263  Total time in minutes: 30   Types of Service: Individual psychotherapy  Interpretor:No. Interpretor Name and Language: N/A  Subjective: Elizabeth Howell is a 26 y.o. female accompanied by  self Patient was referred by Juluis Mire, NP for depression and anxiety. Patient reports the following symptoms/concerns: Reports that she has not felt depressed. Reports that she has obtained a new job. Reports that she has hx of bullying and is proud of the improvement in her self-esteem. Reports that she still is often triggered at times due to bullying. Reports that she is still considering career choices.   Duration of problem: 7 months; Severity of problem: moderate  Objective: Mood: Anxious and Affect: Appropriate Risk of harm to self or others: No plan to harm self or others  Life Context: Family and Social: Reports she currently lives with her grandparents.  School/Work:   Pt is searching for another job. Self-Care: Denies substance use. Reports spending time with friends, looking at tiktok, and listening to music as coping skill. Life Changes: Reports that she has a strained relationship with her father and frequently gets into arguments with him. Reports that she often feels like she gets treated like a child from her grandparents. Reports that she wants to live on her own but does not feel financially ready. (No changes to life context)  Patient and/or Family's Strengths/Protective Factors: Social connections  Goals Addressed: Patient will:  Reduce symptoms of: anxiety and depression   Increase knowledge and/or ability of: coping skills   Demonstrate ability to: Increase adequate support systems for patient/family  Progress towards  Goals: Ongoing  Interventions: Interventions utilized:  CBT Cognitive Behavioral Therapy and Supportive Counseling Standardized Assessments completed: Not Needed  Patient and/or Family Response: Pt receptive to tx. Pt receptive to affirmation for pt's progress. Pt receptive to psychoeducation on trauma. Pt receptive to continuing healthy coping skills with video games and deep breathing.  Patient Centered Plan: Patient is on the following Treatment Plan(s): Depression and anxiety  Assessment: Denies SI/HI. Patient currently experiencing an improvement in mood. Pt has signficant hx of trauma associated with bullying and continues to be triggered at times in social interactions. Pt's self-esteem is improved. Pt continues to process future career choices.   Patient may benefit from outpatient therapy. Pt has to return call to [facility] for therapy. LCSW provided psychoeducation on trauma. LCSW encouraged pt to continue healthy coping skills and call [facility].  Plan: Follow up with behavioral health clinician on : 11/22/21 Behavioral recommendations: Continue healthy coping skills and call [facility] for outpatient therapy. Referral(s): [place] (In [facility]) and Counselor "From scale of 1-10, how likely are you to follow plan?": 10  Frida Wahlstrom C Cameren Odwyer, LCSW

## 2021-11-12 ENCOUNTER — Encounter (HOSPITAL_COMMUNITY): Payer: Self-pay | Admitting: Emergency Medicine

## 2021-11-12 ENCOUNTER — Other Ambulatory Visit: Payer: Self-pay

## 2021-11-12 ENCOUNTER — Ambulatory Visit (HOSPITAL_COMMUNITY)
Admission: EM | Admit: 2021-11-12 | Discharge: 2021-11-12 | Disposition: A | Discharge status: Home / Self Care | Payer: Medicaid Other | Attending: Urgent Care | Admitting: Urgent Care

## 2021-11-12 DIAGNOSIS — R0981 Nasal congestion: Secondary | ICD-10-CM

## 2021-11-12 DIAGNOSIS — H6502 Acute serous otitis media, left ear: Secondary | ICD-10-CM

## 2021-11-12 MED ORDER — AMOXICILLIN 500 MG PO CAPS: 1000.0000 mg | ORAL_CAPSULE | Freq: Two times a day (BID) | ORAL | 0 refills | Status: AC

## 2021-11-12 MED ORDER — FLUTICASONE PROPIONATE 50 MCG/ACT NA SUSP: 1.0000 | Freq: Every day | NASAL | 0 refills | Status: DC

## 2021-11-12 NOTE — ED Triage Notes (Signed)
Pt reports sore throat and left ear pain since Saturday. Pt states the ear pain and sore throat has gone away, now just feels like hearing "double"

## 2021-11-12 NOTE — Discharge Instructions (Signed)
Your symptoms are related to an ear infection in the left ear. Please start taking the antibiotic as prescribed, twice daily until completed. Start using the nasal spray at least 1 spray per nostril daily.  May increase to twice daily as needed. Please follow-up with your PCP in 14 days to ensure complete resolution. Monitor your blood pressure at home and follow-up with your PCP should it remain elevated.

## 2021-11-12 NOTE — ED Provider Notes (Signed)
[facility]    CSN: [id number] Arrival date & time: 11/12/21  1432      History   Chief Complaint Chief Complaint  Patient presents with   Otalgia   Sore Throat    HPI Elizabeth Howell is a 26 y.o. female.   Pleasant 26 year old female presents today with acute concern of ear pain.  She states that on Saturday she started having nasal congestion and a sore throat.  She reports a sore throat resolved spontaneously without treatment, but continues to have worsening ear pain.  States it feels like she is hearing, worse on the left than the right.  She admits to nasal congestion as well.  She also admits to a history of allergies, but does not take anything for them.  Apart from taking over-the-counter ibuprofen, she has not tried any additional treatments.   Otalgia Sore Throat   Past Medical History:  Diagnosis Date   ADHD (attention deficit hyperactivity disorder)    no current med.   Asthma    Depression    Hidradenitis suppurativa of left axilla 07/2017   History of asthma    no current med.   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      Home Medications     Prior to Admission medications   Medication Sig Start Date End Date Taking? Authorizing Provider  amoxicillin (AMOXIL) 500 MG capsule Take 2 capsules (1,000 mg total) by mouth 2 (two) times daily for 10 days. 11/12/21 11/22/21 Yes Nai Borromeo L, PA  fluticasone (FLONASE) 50 MCG/ACT nasal spray Place 1 spray into both nostrils daily. 11/12/21  Yes Evy Lutterman L, PA  buPROPion (WELLBUTRIN SR) 150 MG 12 hr tablet Take 1 tablet (150 mg total) by mouth 2 (two) times daily. 06/18/21   Kerin Perna, NP  EPINEPHrine 0.3 mg/0.3 mL IJ SOAJ injection Inject 0.3 mg into the muscle as needed for anaphylaxis. 05/31/21   Suzy Bouchard, PA-C  famotidine (PEPCID) 20 MG tablet Take 1 tablet (20 mg total) by mouth daily for 5 days. 05/31/21 06/05/21  Suzy Bouchard, PA-C  triamcinolone ointment (KENALOG) 0.1 % Apply 1 application topically 2 (two) times daily. 08/21/21   Valentina Shaggy, MD    Family History Family History  Problem Relation Age of Onset   Healthy Father    Hypertension Maternal Grandfather    Diabetes Other     Social History Social History   Tobacco Use   Smoking status: Never   Smokeless tobacco: Never  Vaping Use   Vaping Use: Never used  Substance Use Topics   Alcohol use: Yes    Comment: occ   Drug use: No  Allergies   Patient has no known allergies.   Review of Systems Review of Systems  HENT:  Positive for ear pain.   As per HPI  Physical Exam Triage Vital Signs ED Triage Vitals  Enc Vitals Group     BP 11/12/21 1613 (!) 143/88     Pulse Rate 11/12/21 1613 (!) 106     Resp 11/12/21 1613 16     Temp 11/12/21 1613 99.3 F (37.4 C)     Temp Source 11/12/21 1613 Oral     SpO2 11/12/21 1613 96 %     Weight 11/12/21 1614 279 lb 15.8 oz (127 kg)     Height 11/12/21 1614 5\' 2"  (1.575 m)     Head Circumference --      Peak Flow --      Pain Score 11/12/21 1614 0     Pain Loc --      Pain Edu? --      Excl. in [place]? --    No data  found.  Updated Vital Signs BP (!) 143/88 (BP Location: Right Arm)    Pulse (!) 106    Temp 99.3 F (37.4 C) (Oral)    Resp 16    Ht 5\' 2"  (1.575 m)    Wt 279 lb 15.8 oz (127 kg)    SpO2 96%    BMI 51.21 kg/m   Visual Acuity Right Eye Distance:   Left Eye Distance:   Bilateral Distance:    Right Eye Near:   Left Eye Near:    Bilateral Near:     Physical Exam Vitals and nursing note reviewed.  Constitutional:      General: She is not in acute distress.    Appearance: She is well-developed. She is obese. She is not ill-appearing, toxic-appearing or diaphoretic.  HENT:     Head: Normocephalic and atraumatic.     Right Ear: Tympanic membrane, ear canal and external ear normal. No drainage, swelling or tenderness. No middle ear effusion. There is no impacted cerumen. Tympanic membrane is not erythematous.     Left Ear: Ear canal and external ear normal. A middle ear effusion is present. There is no impacted cerumen. Tympanic membrane is erythematous.     Ears:     Comments: Bulging TM on L with effusion. No perf. Normal EAC.    Nose: Congestion (worse on L) present. No rhinorrhea.     Mouth/Throat:     Mouth: Mucous membranes are moist. No oral lesions.     Pharynx: Oropharynx is clear. Uvula midline. No pharyngeal swelling, oropharyngeal exudate, posterior oropharyngeal erythema or uvula swelling.     Tonsils: No tonsillar exudate or tonsillar abscesses.  Eyes:     General: No scleral icterus.       Right eye: No discharge.        Left eye: No discharge.     Extraocular Movements: Extraocular movements intact.     Right eye: Normal extraocular motion.     Conjunctiva/sclera: Conjunctivae normal.     Pupils: Pupils are equal, round, and reactive to light.  Neck:     Thyroid: Thyromegaly present.  Cardiovascular:     Rate and Rhythm: Regular rhythm. Tachycardia present.     Pulses: Normal pulses.     Heart sounds: Normal heart sounds. No murmur heard.   No gallop.  Pulmonary:      Effort: Pulmonary effort is normal. No respiratory distress.     Breath sounds: Normal breath sounds. No  stridor. No wheezing, rhonchi or rales.  Chest:     Chest wall: No tenderness.  Abdominal:     Palpations: Abdomen is soft.     Tenderness: There is no abdominal tenderness.  Musculoskeletal:        General: No swelling or tenderness. Normal range of motion.     Cervical back: Normal range of motion and neck supple. No tenderness.     Right lower leg: No edema.     Left lower leg: No edema.  Lymphadenopathy:     Cervical: No cervical adenopathy.  Skin:    General: Skin is warm and dry.     Capillary Refill: Capillary refill takes less than 2 seconds.     Findings: No erythema or rash.  Neurological:     General: No focal deficit present.     Mental Status: She is alert and oriented to person, place, and time.  Psychiatric:        Mood and Affect: Mood normal.     UC Treatments / Results  Labs (all labs ordered are listed, but only abnormal results are displayed) Labs Reviewed - No data to display  EKG   Radiology No results found.  Procedures Procedures (including critical care time)  Medications Ordered in UC Medications - No data to display  Initial Impression / Assessment and Plan / UC Course  I have reviewed the triage vital signs and the nursing notes.  Pertinent labs & imaging results that were available during my care of the patient were reviewed by me and considered in my medical decision making (see chart for details).     OM on L - will start amoxicillin 1 g twice daily x10 days. Nasal congestion -likely secondary to allergies.  May restart antihistamine over-the-counter.  Prescription Flonase will help open up eustachian tube.  Follow-up with PCP in 14 days for recheck Goiter -follow-up with PCP for management Elevated blood pressure with tachycardia -stop all over-the-counter medications containing Sudafed or decongestants.  Follow-up with your  PCP for further management.  Goal blood pressure closer to 120/80.  Final Clinical Impressions(s) / UC Diagnoses   Final diagnoses:  Non-recurrent acute serous otitis media of left ear  Nasal congestion     Discharge Instructions      Your symptoms are related to an ear infection in the left ear. Please start taking the antibiotic as prescribed, twice daily until completed. Start using the nasal spray at least 1 spray per nostril daily.  May increase to twice daily as needed. Please follow-up with your PCP in 14 days to ensure complete resolution. Monitor your blood pressure at home and follow-up with your PCP should it remain elevated.     ED Prescriptions     Medication Sig Dispense Auth. Provider   amoxicillin (AMOXIL) 500 MG capsule Take 2 capsules (1,000 mg total) by mouth 2 (two) times daily for 10 days. 40 capsule Danh Bayus L, PA   fluticasone (FLONASE) 50 MCG/ACT nasal spray Place 1 spray into both nostrils daily. 16 g Kycen Spalla L, [place]      PDMP not reviewed this encounter.   Chaney Malling, [place] 11/12/21 [phone]

## 2021-11-20 ENCOUNTER — Ambulatory Visit: Payer: Medicaid Other | Admitting: Allergy & Immunology

## 2021-11-22 ENCOUNTER — Ambulatory Visit (INDEPENDENT_AMBULATORY_CARE_PROVIDER_SITE_OTHER): Payer: Medicaid Other | Admitting: Clinical

## 2021-11-28 ENCOUNTER — Other Ambulatory Visit: Payer: Self-pay

## 2021-11-28 ENCOUNTER — Encounter (HOSPITAL_COMMUNITY): Payer: Self-pay

## 2021-11-28 ENCOUNTER — Ambulatory Visit (HOSPITAL_COMMUNITY)
Admission: EM | Admit: 2021-11-28 | Discharge: 2021-11-28 | Disposition: A | Discharge status: Home / Self Care | Payer: Medicaid Other | Attending: Nurse Practitioner | Admitting: Nurse Practitioner

## 2021-11-28 DIAGNOSIS — R42 Dizziness and giddiness: Secondary | ICD-10-CM | POA: Insufficient documentation

## 2021-11-28 DIAGNOSIS — R21 Rash and other nonspecific skin eruption: Secondary | ICD-10-CM | POA: Insufficient documentation

## 2021-11-28 LAB — BASIC METABOLIC PANEL
Anion gap: 15 (ref 5–15)
BUN: 10 mg/dL (ref 6–20)
CO2: 19 mmol/L — ABNORMAL LOW (ref 22–32)
Calcium: 9.3 mg/dL (ref 8.9–10.3)
Chloride: 103 mmol/L (ref 98–111)
Creatinine, Ser: 0.76 mg/dL (ref 0.44–1.00)
GFR, Estimated: >60 mL/min (ref >60)
Glucose, Bld: 119 mg/dL — ABNORMAL HIGH (ref 70–99)
Potassium: 4.2 mmol/L (ref 3.5–5.1)
Sodium: 137 mmol/L (ref 135–145)

## 2021-11-28 LAB — POCT URINALYSIS DIPSTICK, ED / UC
Glucose, UA: NEGATIVE mg/dL
Hgb urine dipstick: NEGATIVE
Ketones, ur: NEGATIVE mg/dL
Leukocytes,Ua: NEGATIVE
Nitrite: NEGATIVE
Protein, ur: 100 mg/dL — AB
Specific Gravity, Urine: >=1.03 (ref 1.005–1.030)
Urobilinogen, UA: 0.2 mg/dL (ref 0.0–1.0)
pH: 5.5 (ref 5.0–8.0)

## 2021-11-28 LAB — CBC WITH DIFFERENTIAL/PLATELET
Abs Immature Granulocytes: 0.05 10*3/uL (ref 0.00–0.07)
Basophils Absolute: 0 10*3/uL (ref 0.0–0.1)
Basophils Relative: 0 %
Eosinophils Absolute: 0.1 10*3/uL (ref 0.0–0.5)
Eosinophils Relative: 0 %
HCT: 44.4 % (ref 36.0–46.0)
Hemoglobin: 14.8 g/dL (ref 12.0–15.0)
Immature Granulocytes: 0 %
Lymphocytes Relative: 10 %
Lymphs Abs: 1.4 10*3/uL (ref 0.7–4.0)
MCH: 29.8 pg (ref 26.0–34.0)
MCHC: 33.3 g/dL (ref 30.0–36.0)
MCV: 89.5 fL (ref 80.0–100.0)
Monocytes Absolute: 0.5 10*3/uL (ref 0.1–1.0)
Monocytes Relative: 3 %
Neutro Abs: 11.7 10*3/uL — ABNORMAL HIGH (ref 1.7–7.7)
Neutrophils Relative %: 87 %
Platelets: 375 10*3/uL (ref 150–400)
RBC: 4.96 MIL/uL (ref 3.87–5.11)
RDW: 13.4 % (ref 11.5–15.5)
WBC: 13.7 10*3/uL — ABNORMAL HIGH (ref 4.0–10.5)
nRBC: 0 % (ref 0.0–0.2)

## 2021-11-28 LAB — POC URINE PREG, ED: Preg Test, Ur: NEGATIVE

## 2021-11-28 MED ORDER — DIPHENHYDRAMINE HCL 25 MG PO TABS: 25.0000 mg | ORAL_TABLET | Freq: Four times a day (QID) | ORAL | 0 refills | Status: DC | PRN

## 2021-11-28 MED ORDER — DIPHENHYDRAMINE HCL 25 MG PO CAPS
ORAL_CAPSULE | ORAL | Status: AC
  Filled 2021-11-28: qty 1

## 2021-11-28 MED ORDER — DIPHENHYDRAMINE HCL 25 MG PO CAPS
25.0000 mg | ORAL_CAPSULE | Freq: Once | ORAL | Status: AC
  Administered 2021-11-28: 25 mg via ORAL

## 2021-11-28 MED ORDER — FAMOTIDINE 20 MG PO TABS: 20.0000 mg | ORAL_TABLET | Freq: Two times a day (BID) | ORAL | 0 refills | Status: DC

## 2021-11-28 MED ORDER — PREDNISONE 10 MG (21) PO TBPK: ORAL_TABLET | Freq: Every day | ORAL | 0 refills | Status: DC

## 2021-11-28 MED ORDER — METHYLPREDNISOLONE SODIUM SUCC 125 MG IJ SOLR
INTRAMUSCULAR | Status: AC
  Filled 2021-11-28: qty 2

## 2021-11-28 MED ORDER — METHYLPREDNISOLONE SODIUM SUCC 125 MG IJ SOLR
80.0000 mg | Freq: Once | INTRAMUSCULAR | Status: AC
  Administered 2021-11-28: 80 mg via INTRAMUSCULAR

## 2021-11-28 MED ORDER — FAMOTIDINE 20 MG PO TABS
ORAL_TABLET | ORAL | Status: AC
  Filled 2021-11-28: qty 1

## 2021-11-28 MED ORDER — FAMOTIDINE 20 MG PO TABS
20.0000 mg | ORAL_TABLET | Freq: Once | ORAL | Status: AC
  Administered 2021-11-28: 20 mg via ORAL

## 2021-11-28 NOTE — ED Provider Notes (Addendum)
?[facility] ? ? ? ?CSN: [id number] ?Arrival date & time: 11/28/21  1436 ? ? ?  ? ?History   ?Chief Complaint ?Chief Complaint  ?Patient presents with  ? Rash  ? Dizziness  ? ? ?HPI ?Elizabeth Howell is a 26 y.o. female.  ? ?Patient reports allergic reaction/rash that started last night.  She reports the rash is extremely itchy.  She reports this is happened about a year ago when she was treated with a steroid shot and oral steroids.  Last night, she took 2 Benadryl with moderate relief of the itching.  She denies any new exposures to food or medication.  Denies any new use of detergents.  She reports she had allergy testing done and it was all negative. ? ?She reports concern because since she woke up this morning, she has been dizzy.  The dizziness made her feel short of breath and very tired, lightheaded this morning, however now somewhat improved.  She still feels lightheaded, worse with position changes and body movements.  She denies active chest pain, shortness of breath, palpitations.  She denies throat or tongue itching or swelling. ? ? ?Past Medical History:  ?Diagnosis Date  ? ADHD (attention deficit hyperactivity disorder)   ? no current med.  ? Asthma   ? Depression   ? Hidradenitis suppurativa of left axilla 07/2017  ? History of asthma   ? no current med.  ? Hyperlipidemia   ? Nocturia   ? Pre-diabetes   ? ? ?Patient Active Problem List  ? Diagnosis Date Noted  ? Anxiety and depression 01/14/2017  ? Dyslipidemia 11/19/2016  ? Morbid obesity ([place]) 11/25/2014  ? Insulin resistance 11/25/2014  ? Hyperinsulinemia 11/25/2014  ? Essential hypertension, benign 11/25/2014  ? Acanthosis nigricans, acquired 11/25/2014  ? Dyspepsia 11/25/2014  ? Goiter 11/25/2014  ? Thyroiditis, autoimmune 11/25/2014  ? 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 Skin striae 11/25/2014  ? Prediabetes 11/25/2014  ? ? ?Past Surgical History:  ?Procedure Laterality Date  ? HYDRADENITIS EXCISION Left 07/30/2017  ? Procedure:  EXCISION LEFT AXILLA HIDRADENITIS;  Surgeon: Erroll Luna, MD;  Location: [facility];  Service: General;  Laterality: Left;  ? WISDOM TOOTH EXTRACTION    ? ? ?OB History   ?No obstetric history on file. ?  ? ? ? ?Home Medications   ? ?Prior to Admission medications   ?Medication Sig Start Date End Date Taking? Authorizing Provider  ?diphenhydrAMINE (BENADRYL) 25 MG tablet Take 1 tablet (25 mg total) by mouth every 6 (six) hours as needed. 11/28/21  Yes Eulogio Bear, NP  ?predniSONE (STERAPRED UNI-PAK 21 TAB) 10 MG (21) TBPK tablet Take by mouth daily. Take 6 tabs by mouth daily  for 1 days, then 5 tabs for 1 days, then 4 tabs for 1 days, then 3 tabs for 1 days, 2 tabs for 1 days, then 1 tab by mouth daily for 1 days 11/28/21  Yes Eulogio Bear, NP  ?buPROPion (WELLBUTRIN SR) 150 MG 12 hr tablet Take 1 tablet (150 mg total) by mouth 2 (two) times daily. 06/18/21   Kerin Perna, NP  ?EPINEPHrine 0.3 mg/0.3 mL IJ SOAJ injection Inject 0.3 mg into the muscle as needed for anaphylaxis. 05/31/21   Suzy Bouchard, PA-C  ?famotidine (PEPCID) 20 MG tablet Take 1 tablet (20 mg total) by mouth 2 (two) times daily for 14 days. 11/28/21 12/12/21  Eulogio Bear, NP  ?fluticasone (FLONASE) 50 MCG/ACT nasal spray Place 1 spray into both  nostrils daily. 11/12/21   Crain, Loree Fee L, PA  ?triamcinolone ointment (KENALOG) 0.1 % Apply 1 application topically 2 (two) times daily. 08/21/21   Valentina Shaggy, MD  ? ? ?Family History ?Family History  ?Problem Relation Age of Onset  ? Healthy Father   ? Hypertension Maternal Grandfather   ? Diabetes Other   ? ? ?Social History ?Social History  ? ?Tobacco Use  ? Smoking status: Never  ? Smokeless tobacco: Never  ?Vaping Use  ? Vaping Use: Never used  ?Substance Use Topics  ? Alcohol use: Yes  ?  Comment: occ  ? Drug use: No  ? ? ? ?Allergies   ?Patient has no known allergies. ? ? ?Review of Systems ?Review of Systems ?Per HPI ? ?Physical  Exam ?Triage Vital Signs ?ED Triage Vitals  ?Enc Vitals Group  ?   BP 11/28/21 1558 91/66  ?   Pulse Rate 11/28/21 1557 (!) 117  ?   Resp 11/28/21 1557 17  ?   Temp 11/28/21 1557 98.4 ?F (36.9 ?C)  ?   Temp Source 11/28/21 1557 Oral  ?   SpO2 11/28/21 1557 98 %  ?   Weight --   ?   Height --   ?   Head Circumference --   ?   Peak Flow --   ?   Pain Score 11/28/21 1555 0  ?   Pain Loc --   ?   Pain Edu? --   ?   Excl. in [place]? --   ? ?No data found. ? ?Updated Vital Signs ?BP 138/82 (BP Location: Left Arm)   Pulse (!) 115   Temp 98.4 ?F (36.9 ?C) (Oral)   Resp 17   LMP 11/16/2021 (Exact Date)   SpO2 96%  ? ?Visual Acuity ?Right Eye Distance:   ?Left Eye Distance:   ?Bilateral Distance:   ? ?Right Eye Near:   ?Left Eye Near:    ?Bilateral Near:    ? ?Physical Exam ?Vitals and nursing note reviewed.  ?Constitutional:   ?   General: She is not in acute distress. ?   Appearance: Normal appearance. She is obese. She is not ill-appearing, toxic-appearing or diaphoretic.  ?HENT:  ?   Head: Normocephalic and atraumatic.  ?   Mouth/Throat:  ?   Mouth: Mucous membranes are moist.  ?   Pharynx: Oropharynx is clear. No oropharyngeal exudate or posterior oropharyngeal erythema.  ?Eyes:  ?   General: No scleral icterus. ?   Extraocular Movements: Extraocular movements intact.  ?Cardiovascular:  ?   Rate and Rhythm: Tachycardia present.  ?Pulmonary:  ?   Effort: Pulmonary effort is normal. No respiratory distress.  ?   Breath sounds: Normal breath sounds. No wheezing, rhonchi or rales.  ?Musculoskeletal:  ?   Cervical back: Normal range of motion and neck supple.  ?Lymphadenopathy:  ?   Cervical: No cervical adenopathy.  ?Skin: ?   General: Skin is warm and dry.  ?   Capillary Refill: Capillary refill takes less than 2 seconds.  ?   Findings: Rash present. Rash is papular and urticarial.  ?   Comments: Widespread hypopigmented papules to bilateral arms, trunk, legs with excoriation marks.  No erythema/warmth  ?Neurological:  ?    Mental Status: She is alert and oriented to person, place, and time.  ?   Motor: No weakness.  ?   Gait: Gait normal.  ? ? ? ?UC Treatments / Results  ?Labs ?(all labs ordered  are listed, but only abnormal results are displayed) ?Labs Reviewed  ?CBC WITH DIFFERENTIAL/PLATELET - Abnormal; Notable for the following components:  ?    Result Value  ? WBC 13.7 (*)   ? Neutro Abs 11.7 (*)   ? All other components within normal limits  ?BASIC METABOLIC PANEL - Abnormal; Notable for the following components:  ? CO2 19 (*)   ? Glucose, Bld 119 (*)   ? All other components within normal limits  ?POCT URINALYSIS DIPSTICK, ED / UC - Abnormal; Notable for the following components:  ? Bilirubin Urine SMALL (*)   ? Protein, ur 100 (*)   ? All other components within normal limits  ?POC URINE PREG, ED  ?POCT URINALYSIS DIPSTICK, ED / UC  ? ? ?EKG ? ? ?Radiology ?No results found. ? ?Procedures ?Procedures (including critical care time) ? ?Medications Ordered in UC ?Medications  ?methylPREDNISolone sodium succinate (SOLU-MEDROL) 125 mg/2 mL injection 80 mg (80 mg Intramuscular Given 11/28/21 1629)  ?famotidine (PEPCID) tablet 20 mg (20 mg Oral Given 11/28/21 1629)  ?diphenhydrAMINE (BENADRYL) capsule 25 mg (25 mg Oral Given 11/28/21 1629)  ? ? ?Initial Impression / Assessment and Plan / UC Course  ?I have reviewed the triage vital signs and the nursing notes. ? ?Pertinent labs & imaging results that were available during my care of the patient were reviewed by me and considered in my medical decision making (see chart for details). ? ?  ?Given dizziness and rash, concern for anaphylaxis, although would be delayed/prolonged onset.  Check UA, U preg, CBC, BMET today.  Solu-Medrol 125 mg IM given along with famotidine 20 mg and diphenhydramine 25 mg.  Her grandmother drove her to urgent care today and will be driving her home. ? ?Update: about 1 hour after medication, patient reports feeling much better and no longer dizzy.  She also  reports the itchiness has decreased.  Blood counts show mild leukocytosis, suspect related to rash.  Will start prednisone taper tomorrow, twice daily famotidine for the next 2 weeks, and diphenhydramine 25 mg every 6

## 2021-11-28 NOTE — Discharge Instructions (Addendum)
-   Today, we gave you a steroid shot, Pepcid, and Benadryl. ?- Please start the prednisone taper tomorrow. ?- Please continue taking the Pepcid 20 mg twice daily for 2 weeks ?-You can also take Benadryl 25 mg every 6 hours as needed for itching. ?-The urine testing and blood work came back stable considering your symptoms.  Please follow-up with primary care provider. ? ?

## 2021-11-28 NOTE — ED Triage Notes (Signed)
Pt c/o a rash all over her body that is itchy. States it has been going on for 3 days.  ?

## 2021-12-04 ENCOUNTER — Encounter: Payer: Self-pay | Admitting: Allergy & Immunology

## 2021-12-04 ENCOUNTER — Other Ambulatory Visit: Payer: Self-pay

## 2021-12-04 ENCOUNTER — Ambulatory Visit (INDEPENDENT_AMBULATORY_CARE_PROVIDER_SITE_OTHER): Payer: Medicaid Other | Admitting: Allergy & Immunology

## 2021-12-04 VITALS — BP 100/70 | HR 106 | Temp 97.8°F | Resp 16 | Ht 62.0 in | Wt 272.0 lb

## 2021-12-04 DIAGNOSIS — R21 Rash and other nonspecific skin eruption: Secondary | ICD-10-CM | POA: Diagnosis not present

## 2021-12-04 DIAGNOSIS — T7840XD Allergy, unspecified, subsequent encounter: Secondary | ICD-10-CM | POA: Diagnosis not present

## 2021-12-04 NOTE — Patient Instructions (Addendum)
1. 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 ?- We are going to get a seafood panel. ?- We will get a sesame IgE as well.  ?- We will call you with the results in 1-2 weeks.  ?- EpiPen is up to date.  ? ?2. Return in about 3 months (around 03/06/2022).  ? ? ?Please inform us of any Emergency Department visits, hospitalizations, or changes in symptoms. Call us before going to the ED for breathing or allergy symptoms since we might be able to fit you in for a sick visit. Feel free to contact us anytime with any questions, problems, or concerns. ? ?It was a pleasure to see you again today! ? ?Websites that have reliable patient information: ?1. American Academy of Asthma, Allergy, and Immunology: www.aaaai.org ?2. Food Allergy Research and Education (FARE): foodallergy.org ?3. Mothers of Asthmatics: http://www.asthmacommunitynetwork.org ?4. [employer] of Allergy, Asthma, and Immunology: MonthlyElectricBill.co.uk ? ? ?COVID-19 Vaccine Information can be found at: ShippingScam.co.uk For questions related to vaccine distribution or appointments, please email [email or web address] or call [phone].  ? ?We realize that you might be concerned about having an allergic reaction to the COVID19 vaccines. To help with that concern, WE ARE OFFERING THE COVID19 VACCINES IN OUR OFFICE! Ask the front desk for dates!  ? ? ? ??Like? [place] on Facebook and Instagram for our latest updates!  ?  ? ? ?A healthy democracy works best when [employer] participate! Make sure you are registered to vote! If you have moved or changed any of your contact information, you will need to get this updated before voting! ? ?In some cases, you MAY be able to register to vote online: [email or web address] ? ? ? ? ? ? ? ? ? ?

## 2021-12-04 NOTE — Progress Notes (Signed)
? ?FOLLOW UP ? ?Date of Service/Encounter:  12/04/21 ? ? ?Assessment:  ? ?Allergic reaction - unknown trigger but we are continuing to work on this ?  ?Rash - unknown trigger ?  ?History of asthma as a child ?  ?Family of Elizabeth Howell  ?  ? ?Plan/Recommendations:  ? ?1. 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 ?- We are going to get a seafood panel. ?- We will get a sesame IgE as well.  ?- We will call you with the results in 1-2 weeks.  ?- EpiPen is up to date.  ? ?2. Return in about 3 months (around 03/06/2022).  ? ? ?Subjective:  ? ?Elizabeth Howell is a 26 y.o. female presenting today for follow up of  ?Chief Complaint  ?Patient presents with  ? Allergic Rhinitis   ?  Last week: sushi from regular place, deviled eggs, (around 4pm had itching on face and lower lip, had hives all over her lower face), took benadryl. Woke up next morning and hives spread all over her body and was itching. Got fatigued and thought she was going to pass out and went to urgent care ([facility], urine test).  ? Urticaria  ? ? ?Elizabeth Howell has a history of the following: ?Patient Active Problem List  ? Diagnosis Date Noted  ? Anxiety and depression 01/14/2017  ? Dyslipidemia 11/19/2016  ? Morbid obesity ([place]) 11/25/2014  ? Insulin resistance 11/25/2014  ? Hyperinsulinemia 11/25/2014  ? Essential hypertension, benign 11/25/2014  ? Acanthosis nigricans, acquired 11/25/2014  ? Dyspepsia 11/25/2014  ? Goiter 11/25/2014  ? Thyroiditis, autoimmune 11/25/2014  ? 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 Skin striae 11/25/2014  ? Prediabetes 11/25/2014  ? ? ?History obtained from: chart review and patient. ? ?Elizabeth Howell is a 26 y.o. female presenting for a follow up visit. She was last seen in December 2022. At that time, testing was negative to the environmental allergy panel as well as most common foods and avocado.  We did provide her with an epinephrine autoinjector.  Stinging insect  panel was positive to yellowjacket and paper wasp.  Serum tryptase was normal.  An alpha gal panel was negative. ? ?Since last visit, she had another reaction when she ate sushi. Symptoms started with facial itchiness around 4pm and then later she broke out in hives over her face. This one was much worse. She did not have to use her EpiPen. She orders from this place routinely, but something about this time did not agree with her. In that dish particular,y she had tuna and salad. This is typically what she gets in bowls. She did not have involvement of avocado. She has had that more recently and tolerated it without a problem. She is working at [employer] and it has [employer].  ? ?She has tolerated cilantro, rice, corn, garlic and onions without a problem.  These were the other items in the dish that she ate.  She has not needed since the reaction without a problem.  She did not have to use her EpiPen. ? ?She tried a new eyeliner as well from [employer]. But her reaction was everywhere, not just on her eyes.  She denies any stings with insects.  She does show me pictures that clearly show facial edema as well as some urticaria. ? ?Otherwise, there have been no changes to her past medical history, surgical history, family history, or social history. ? ? ? ?Review of Systems  ?  Constitutional: Negative.  Negative for fever, malaise/fatigue and weight loss.  ?HENT: Negative.  Negative for congestion, ear discharge and ear pain.   ?Eyes:  Negative for pain, discharge and redness.  ?Respiratory:  Negative for cough, sputum production, shortness of breath and wheezing.   ?Cardiovascular: Negative.  Negative for chest pain and palpitations.  ?Gastrointestinal:  Negative for abdominal pain, constipation, diarrhea, heartburn, nausea and vomiting.  ?Skin: Negative.  Negative for itching and rash.  ?Neurological:  Negative for dizziness and headaches.  ?Endo/Heme/Allergies:  Negative for environmental allergies. Does not  bruise/bleed easily.   ? ? ? ?Objective:  ? ?Blood pressure 100/70, pulse (!) 106, temperature 97.8 ?F (36.6 ?C), temperature source Temporal, resp. rate 16, height '5\' 2"'$  (1.575 m), weight 272 lb (123.4 kg), last menstrual period 11/16/2021, SpO2 98 %. ?Body mass index is 49.75 kg/m?. ? ? ? ?Physical Exam ?Vitals reviewed.  ?Constitutional:   ?   Appearance: She is well-developed. She is obese.  ?   Comments: Very talkative.  Pleasant.  ?HENT:  ?   Head: Normocephalic and atraumatic.  ?   Right Ear: Tympanic membrane, ear canal and external ear normal. No drainage, swelling or tenderness. Tympanic membrane is not injected, scarred, erythematous, retracted or bulging.  ?   Left Ear: Tympanic membrane, ear canal and external ear normal. No drainage, swelling or tenderness. Tympanic membrane is not injected, scarred, erythematous, retracted or bulging.  ?   Nose: No nasal deformity, septal deviation, mucosal edema or rhinorrhea.  ?   Right Turbinates: Enlarged, swollen and pale.  ?   Left Turbinates: Enlarged, swollen and pale.  ?   Right Sinus: No maxillary sinus tenderness or frontal sinus tenderness.  ?   Left Sinus: No maxillary sinus tenderness or frontal sinus tenderness.  ?   Mouth/Throat:  ?   Mouth: Mucous membranes are not pale and not dry.  ?   Pharynx: Uvula midline.  ?Eyes:  ?   General:     ?   Right eye: No discharge.     ?   Left eye: No discharge.  ?   Conjunctiva/sclera: Conjunctivae normal.  ?   Right eye: Right conjunctiva is not injected. No chemosis. ?   Left eye: Left conjunctiva is not injected. No chemosis. ?   Pupils: Pupils are equal, round, and reactive to light.  ?Cardiovascular:  ?   Rate and Rhythm: Normal rate and regular rhythm.  ?   Heart sounds: Normal heart sounds.  ?Pulmonary:  ?   Effort: Pulmonary effort is normal. No tachypnea, accessory muscle usage or respiratory distress.  ?   Breath sounds: Normal breath sounds. No wheezing, rhonchi or rales.  ?   Comments: Moving air well in  all lung fields.  No increased work of breathing. ?Chest:  ?   Chest wall: No tenderness.  ?Abdominal:  ?   Tenderness: There is no abdominal tenderness. There is no guarding or rebound.  ?Lymphadenopathy:  ?   Head:  ?   Right side of head: No submandibular, tonsillar or occipital adenopathy.  ?   Left side of head: No submandibular, tonsillar or occipital adenopathy.  ?   Cervical: No cervical adenopathy.  ?Skin: ?   General: Skin is warm.  ?   Capillary Refill: Capillary refill takes less than 2 seconds.  ?   Coloration: Skin is not pale.  ?   Findings: No abrasion, erythema, petechiae or rash. Rash is not papular, urticarial or vesicular.  ?  Comments: No eczematous or urticarial lesions noted.  She does have what appear to be hyperpigmented lesions on her bilateral arms.  They are somewhat circular in appearance.  There are some excoriations present.  ?Neurological:  ?   Mental Status: She is alert.  ?Psychiatric:     ?   Behavior: Behavior is cooperative.  ?  ? ?Diagnostic studies: labs sent instead ? ? ? ? ?  ?Salvatore Marvel, MD  ?[facility] ? ? ? ? ? ? ?

## 2021-12-06 ENCOUNTER — Ambulatory Visit (INDEPENDENT_AMBULATORY_CARE_PROVIDER_SITE_OTHER): Payer: Medicaid Other | Admitting: Clinical

## 2021-12-06 ENCOUNTER — Other Ambulatory Visit: Payer: Self-pay

## 2021-12-06 DIAGNOSIS — F331 Major depressive disorder, recurrent, moderate: Secondary | ICD-10-CM | POA: Diagnosis not present

## 2021-12-06 LAB — TRYPTASE: Tryptase: 2.2 ug/L (ref 2.2–13.2)

## 2021-12-06 LAB — ALLERGY PANEL 19, SEAFOOD GROUP
Allergen Salmon IgE: <0.1 kU/L
Catfish: <0.1 kU/L
Codfish IgE: <0.1 kU/L
F023-IgE Crab: 0.43 kU/L — AB
F080-IgE Lobster: 0.24 kU/L — AB
Shrimp IgE: 0.59 kU/L — AB
Tuna: <0.1 kU/L

## 2021-12-06 LAB — ALLERGEN SESAME F10: Sesame Seed IgE: <0.1 kU/L

## 2021-12-12 NOTE — BH Specialist Note (Signed)
Integrated Behavioral Health Follow Up In-Person Visit ? ?MRN: [id number] ?Name: Elizabeth Howell ? ?Number of [facility] Clinician visits: Additional Visit ? ?Session Start time: 1615 ?  ?Session End time: 4098 ? ?Total time in minutes: 30 ? ? ?Types of Service: Individual psychotherapy ? ?Interpretor:No. Interpretor Name and Language: N/A ? ?Subjective: ?Elizabeth Howell is a 26 y.o. female accompanied by  self ?Patient was referred by Juluis Mire, NP for depression and anxiety. ?Patient reports the following symptoms/concerns: Reports that she experienced a period of sadness and irritability due to a disagreement with her grandfather. Reports that her grandparents constantly question her spending habits. Reports that she now has to pay her grandparents $400/month for rent while she is trying to save in order to move out.  ?Duration of problem: 8 months; Severity of problem: moderate ? ?Objective: ?Mood: Anxious and Affect: Appropriate ?Risk of harm to self or others: No plan to harm self or others ? ?Life Context: ?Family and Social: Reports she currently lives with her grandparents.  ?School/Work:   Pt has obtained new employment.  ?Self-Care: Denies substance use. Reports spending time with friends, looking at tiktok, and listening to music as coping skill. ?Life Changes: Reports that she has a strained relationship with her father and frequently gets into arguments with him. Reports that she often feels like she gets treated like a child from her grandparents. Reports that she is trying to save money so that she can move out.  ? ?Patient and/or Family's Strengths/Protective Factors: ?Social connections ? ?Goals Addressed: ?Patient will: ? Reduce symptoms of: anxiety and depression  ? Increase knowledge and/or ability of: coping skills  ? Demonstrate ability to: Increase adequate support systems for patient/family ? ?Progress towards Goals: ?Ongoing ? ?Interventions: ?Interventions  utilized:  CBT Cognitive Behavioral Therapy, Supportive Counseling, and Communication Skills ?Standardized Assessments completed: Not Needed ? ?Patient and/or Family Response: Pt receptive to tx. Pt receptive to cognitive restructuring. Pt receptive to assistance with communication in order to improve communication with parents. ? ?Patient Centered Plan: ?Patient is on the following Treatment Plan(s): Depression and anxiety ? ?Assessment: ?Denies SI/HI. Patient currently experiencing problems with her grandparents that affect her mood. Pt continues to have problems with her grandparents and has experienced difficulty communicating with them.  ? ?Patient may benefit from increasing communication. LCSW utilized Adult nurse. LCSW provided assistance with communication and encouraged pt to increase communication with grandparents about her concerns. Pt is waiting to hear back from [place] for an appt. ? ?Plan: ?Follow up with behavioral health clinician on : 12/27/21 ?Behavioral recommendations: Increase communication with grandparents. ?Referral(s): Counselor ?"From scale of 1-10, how likely are you to follow plan?": 10 ? ?Gailen Venne C [name], LCSW ? ? ?

## 2021-12-27 ENCOUNTER — Ambulatory Visit (INDEPENDENT_AMBULATORY_CARE_PROVIDER_SITE_OTHER): Payer: Medicaid Other | Admitting: Clinical

## 2022-01-19 ENCOUNTER — Other Ambulatory Visit: Payer: Self-pay

## 2022-01-19 ENCOUNTER — Encounter (HOSPITAL_COMMUNITY): Payer: Self-pay

## 2022-01-19 ENCOUNTER — Emergency Department (HOSPITAL_COMMUNITY)
Admission: EM | Admit: 2022-01-19 | Discharge: 2022-01-19 | Disposition: A | Discharge status: Home / Self Care | Payer: Medicaid Other | Attending: Emergency Medicine | Admitting: Emergency Medicine

## 2022-01-19 DIAGNOSIS — W274XXA Contact with kitchen utensil, initial encounter: Secondary | ICD-10-CM | POA: Diagnosis not present

## 2022-01-19 DIAGNOSIS — S61216A Laceration without foreign body of right little finger without damage to nail, initial encounter: Secondary | ICD-10-CM | POA: Diagnosis not present

## 2022-01-19 DIAGNOSIS — S6991XA Unspecified injury of right wrist, hand and finger(s), initial encounter: Secondary | ICD-10-CM | POA: Diagnosis present

## 2022-01-19 MED ORDER — CEPHALEXIN 500 MG PO CAPS: 500.0000 mg | ORAL_CAPSULE | Freq: Two times a day (BID) | ORAL | 0 refills | Status: AC

## 2022-01-19 MED ORDER — LIDOCAINE HCL (PF) 1 % IJ SOLN
10.0000 mL | Freq: Once | INTRAMUSCULAR | Status: AC
  Administered 2022-01-19: 10 mL
  Filled 2022-01-19: qty 30

## 2022-01-19 NOTE — ED Notes (Signed)
An After Visit Summary was printed and given to the patient. Discharge instructions given and no further questions at this time.  

## 2022-01-19 NOTE — ED Provider Notes (Signed)
?[facility] DEPT ?Provider Note ? ? ?CSN: [id number] ?Arrival date & time: 01/19/22  2028 ? ?  ? ?History ? ?Chief Complaint  ?Patient presents with  ? Laceration  ? ? ?Elizabeth Howell is a 26 y.o. female. ? ?Patient injured her right hand fifth digit using a potato cutter.  Injury occurred just 1 hour prior to arrival.  Denies injury elsewhere.  Her tetanus shots are up-to-date per patient.  Denies recent illnesses such as fevers cough vomiting or diarrhea. ? ? ?  ? ?Home Medications ?Prior to Admission medications   ?Medication Sig Start Date End Date Taking? Authorizing Provider  ?cephALEXin (KEFLEX) 500 MG capsule Take 1 capsule (500 mg total) by mouth 2 (two) times daily for 3 days. 01/19/22 01/22/22 Yes Luna Fuse, MD  ?buPROPion [facility] SR) 150 MG 12 hr tablet Take 1 tablet (150 mg total) by mouth 2 (two) times daily. 06/18/21   Kerin Perna, NP  ?diphenhydrAMINE (BENADRYL) 25 MG tablet Take 1 tablet (25 mg total) by mouth every 6 (six) hours as needed. 11/28/21   Eulogio Bear, NP  ?EPINEPHrine 0.3 mg/0.3 mL IJ SOAJ injection Inject 0.3 mg into the muscle as needed for anaphylaxis. 05/31/21   Suzy Bouchard, PA-C  ?famotidine (PEPCID) 20 MG tablet Take 1 tablet (20 mg total) by mouth 2 (two) times daily for 14 days. 11/28/21 12/12/21  Eulogio Bear, NP  ?fluticasone (FLONASE) 50 MCG/ACT nasal spray Place 1 spray into both nostrils daily. 11/12/21   Crain, Loree Fee L, PA  ?triamcinolone ointment (KENALOG) 0.1 % Apply 1 application topically 2 (two) times daily. ?Patient not taking: Reported on 12/04/2021 08/21/21   Valentina Shaggy, MD  ?   ? ?Allergies    ?Patient has no known allergies.   ? ?Review of Systems   ?Review of Systems  ?Constitutional:  Negative for fever.  ?HENT:  Negative for ear pain.   ?Eyes:  Negative for pain.  ?Respiratory:  Negative for cough.   ?Cardiovascular:  Negative for chest pain.  ?Gastrointestinal:  Negative for abdominal  pain.  ?Genitourinary:  Negative for flank pain.  ?Musculoskeletal:  Negative for back pain.  ?Skin:  Negative for rash.  ?Neurological:  Negative for headaches.  ? ?Physical Exam ?Updated Vital Signs ?BP 110/86   Pulse 89   Temp 98.6 ?F (37 ?C) (Oral)   Resp 18   Ht '5\' 2"'$  (1.575 m)   Wt 120.7 kg   SpO2 98%   BMI 48.65 kg/m?  ?Physical Exam ?Constitutional:   ?   General: She is not in acute distress. ?   Appearance: Normal appearance.  ?HENT:  ?   Head: Normocephalic.  ?   Nose: Nose normal.  ?Eyes:  ?   Extraocular Movements: Extraocular movements intact.  ?Cardiovascular:  ?   Rate and Rhythm: Normal rate.  ?Pulmonary:  ?   Effort: Pulmonary effort is normal.  ?Musculoskeletal:     ?   General: Normal range of motion.  ?   Cervical back: Normal range of motion.  ?Skin: ?   Comments: Right hand fifth digit on the pulp of the distal fingertip she has a 3 cm curvilinear flap.  No involvement of the nailbed.  Otherwise neurovascularly intact fingertip.  ?Neurological:  ?   General: No focal deficit present.  ?   Mental Status: She is alert. Mental status is at baseline.  ? ? ?ED Results / Procedures / Treatments   ?Labs ?(all labs  ordered are listed, but only abnormal results are displayed) ?Labs Reviewed - No data to display ? ?EKG ?None ? ?Radiology ?No results found. ? ?Procedures ?.Nerve Block ? ?Date/Time: 01/19/2022 9:51 PM ?Performed by: Luna Fuse, MD ?Authorized by: Luna Fuse, MD  ? ?Comments:  ?   Site prepped was alcohol swabs x3.  21-gauge needle used to instill a total of 4 cc on each side of the right hand fifth finger.  Good anesthesia achieved.  Patient Toller procedure well. ? ? ? ? ?Marland Kitchen.Laceration Repair ? ?Date/Time: 01/19/2022 9:52 PM ?Performed by: Luna Fuse, MD ?Authorized by: Luna Fuse, MD  ? ?Comments:  ?   Wound thoroughly irrigated with sterile water.  No foreign body seen in a bloodless field.  Wound edges approximated with 4-0 Ethilon sutures x3.  Coban dressing applied  afterwards.  No active bleeding noted.  ? ? ?Medications Ordered in ED ?Medications  ?lidocaine (PF) (XYLOCAINE) 1 % injection 10 mL (10 mLs Infiltration Given by Other 01/19/22 2107)  ? ? ?ED Course/ Medical Decision Making/ A&P ?  ?                        ?Medical Decision Making ?Risk ?Prescription drug management. ? ? ?History obtained from her father at bedside. ? ?Chart review shows episode of behavioral health visit on December 06, 2021. ? ?Digital block performed myself 1% epinephrine total of 8 cc instilled with good anesthesia achieved. ? ? ? ? ? ? ? ? ? ?Final Clinical Impression(s) / ED Diagnoses ?Final diagnoses:  ?Laceration of right little finger without foreign body without damage to nail, initial encounter  ? ? ?Rx / DC Orders ?ED Discharge Orders   ? ?      Ordered  ?  cephALEXin (KEFLEX) 500 MG capsule  2 times daily       ? 01/19/22 2141  ? ?  ?  ? ?  ? ? ?  ?Luna Fuse, MD ?01/19/22 2152 ? ?

## 2022-01-19 NOTE — Discharge Instructions (Signed)
Keep the wound dry for 2 days.  After 2 days you may wash gently with soap and water.  Keep the wound covered for 1 week. ? ?Return in 10 days to remove the stitches. ? ?Return immediately if you have signs of infection such as increased pain increased redness fevers or any additional concerns. ?

## 2022-01-19 NOTE — ED Triage Notes (Signed)
Patient cut her right pinky finger on a potato cutter. Around 1cm flap of skin is hanging off base of pinky finger. Does not know when last tetanus was.  ?

## 2022-01-21 ENCOUNTER — Telehealth: Payer: Self-pay

## 2022-01-21 NOTE — Telephone Encounter (Signed)
Transition Care Management Follow-up Telephone Call ?Date of discharge and from where: 01/20/2022-[facility] ?How have you been since you were released from the hospital? Pt stated she is feeling better  ?Any questions or concerns? No ? ?Items Reviewed: ?Did the pt receive and understand the discharge instructions provided? Yes  ?Medications obtained and verified? Yes  ?Other? No  ?Any new allergies since your discharge? No  ?Dietary orders reviewed? No ?Do you have support at home? Yes  ? ?Home Care and Equipment/Supplies: ?Were home health services ordered? not applicable ?If so, what is the name of the agency? N/A  ?Has the agency set up a time to come to the patient's home? not applicable ?Were any new equipment or medical supplies ordered?  No ?What is the name of the medical supply agency? N/A ?Were you able to get the supplies/equipment? not applicable ?Do you have any questions related to the use of the equipment or supplies? No ? ?Functional Questionnaire: (I = Independent and D = Dependent) ?ADLs: I ? ?Bathing/Dressing- I ? ?Meal Prep- I ? ?Eating- I ? ?Maintaining continence- I ? ?Transferring/Ambulation- I ? ?Managing Meds- I ? ?Follow up appointments reviewed: ? ?PCP Hospital f/u appt confirmed? No   ?Specialist Hospital f/u appt confirmed? No   ?Are transportation arrangements needed? No  ?If their condition worsens, is the pt aware to call PCP or go to the Emergency Dept.? Yes ?Was the patient provided with contact information for the PCP's office or ED? Yes ?Was to pt encouraged to call back with questions or concerns? Yes  ?

## 2022-01-29 ENCOUNTER — Other Ambulatory Visit: Payer: Self-pay

## 2022-01-29 ENCOUNTER — Encounter (HOSPITAL_COMMUNITY): Payer: Self-pay

## 2022-01-29 ENCOUNTER — Emergency Department (HOSPITAL_COMMUNITY)
Admission: EM | Admit: 2022-01-29 | Discharge: 2022-01-29 | Disposition: A | Discharge status: Home / Self Care | Payer: Medicaid Other | Attending: Emergency Medicine | Admitting: Emergency Medicine

## 2022-01-29 DIAGNOSIS — Z4802 Encounter for removal of sutures: Secondary | ICD-10-CM | POA: Insufficient documentation

## 2022-01-29 DIAGNOSIS — S61216D Laceration without foreign body of right little finger without damage to nail, subsequent encounter: Secondary | ICD-10-CM | POA: Diagnosis not present

## 2022-01-29 DIAGNOSIS — J45909 Unspecified asthma, uncomplicated: Secondary | ICD-10-CM | POA: Diagnosis not present

## 2022-01-29 NOTE — Discharge Instructions (Signed)
Get help right away if: ?You have a fever or chills. ?You have red streaks coming from your wound. ?

## 2022-01-29 NOTE — ED Triage Notes (Signed)
Patient states she is here for suture removal to the right pinky finger. ?

## 2022-01-29 NOTE — ED Provider Notes (Signed)
?[facility] DEPT ?Provider Note ? ? ?CSN: [id number] ?Arrival date & time: 01/29/22  1740 ? ?  ? ?History ? ?Chief Complaint  ?Patient presents with  ? Suture / Staple Removal  ? ? ?Elizabeth Howell is a 26 y.o. female with history of asthma, prediabetes, ADHD, depression.  Presents to the emergency department for suture removal.  Patient reports that she had sutures placed to right fifth digit on 5/6.  Patient denies any fever, chills, swelling, purulent discharge, erythema, pallor, weakness.  Patient does not dorsum decree sensation near laceration site.  Patient is right-hand dominant. ? ? ?Suture / Staple Removal ? ? ?  ? ?Home Medications ?Prior to Admission medications   ?Medication Sig Start Date End Date Taking? Authorizing Provider  ?buPROPion (WELLBUTRIN SR) 150 MG 12 hr tablet Take 1 tablet (150 mg total) by mouth 2 (two) times daily. 06/18/21   Kerin Perna, NP  ?diphenhydrAMINE (BENADRYL) 25 MG tablet Take 1 tablet (25 mg total) by mouth every 6 (six) hours as needed. 11/28/21   Eulogio Bear, NP  ?EPINEPHrine 0.3 mg/0.3 mL IJ SOAJ injection Inject 0.3 mg into the muscle as needed for anaphylaxis. 05/31/21   Suzy Bouchard, PA-C  ?famotidine (PEPCID) 20 MG tablet Take 1 tablet (20 mg total) by mouth 2 (two) times daily for 14 days. 11/28/21 12/12/21  Eulogio Bear, NP  ?fluticasone (FLONASE) 50 MCG/ACT nasal spray Place 1 spray into both nostrils daily. 11/12/21   Crain, Loree Fee L, PA  ?triamcinolone ointment (KENALOG) 0.1 % Apply 1 application topically 2 (two) times daily. ?Patient not taking: Reported on 12/04/2021 08/21/21   Valentina Shaggy, MD  ?   ? ?Allergies    ?Patient has no known allergies.   ? ?Review of Systems   ?Review of Systems  ?Constitutional:  Negative for chills and fever.  ?Musculoskeletal:  Negative for arthralgias and myalgias.  ?Skin:  Positive for wound. Negative for color change, pallor and rash.  ?Neurological:  Positive  for numbness. Negative for weakness.  ? ?Physical Exam ?Updated Vital Signs ?BP 115/86 (BP Location: Right Arm)   Pulse (!) 111   Temp 98.6 ?F (37 ?C) (Oral)   Resp 18   Ht '5\' 2"'$  (1.575 m)   Wt 120.7 kg   LMP 01/15/2022   SpO2 100%   BMI 48.65 kg/m?  ?Physical Exam ?Vitals and nursing note reviewed.  ?Constitutional:   ?   General: She is not in acute distress. ?   Appearance: She is not ill-appearing, toxic-appearing or diaphoretic.  ?HENT:  ?   Head: Normocephalic.  ?Eyes:  ?   General: No scleral icterus.    ?   Right eye: No discharge.     ?   Left eye: No discharge.  ?Cardiovascular:  ?   Rate and Rhythm: Normal rate.  ?Pulmonary:  ?   Effort: Pulmonary effort is normal.  ?Musculoskeletal:  ?   Right hand: No swelling, deformity, lacerations, tenderness or bony tenderness. Normal range of motion. Normal strength. Decreased sensation. Normal capillary refill. Normal pulse.  ?   Comments: Well-healed laceration to dorsum distal to DIP of right fifth finger.  3 sutures noted in place.  No surrounding erythema, warmth, or purulent discharge.  Patient has full range of motion to affected digit.  Cap refill less than 2 seconds in affected digit.  Patient does endorse some decrease sensation to laceration site.  ?Skin: ?   General: Skin is warm and dry.  ?  Neurological:  ?   General: No focal deficit present.  ?   Mental Status: She is alert.  ?Psychiatric:     ?   Behavior: Behavior is cooperative.  ? ? ?ED Results / Procedures / Treatments   ?Labs ?(all labs ordered are listed, but only abnormal results are displayed) ?Labs Reviewed - No data to display ? ?EKG ?None ? ?Radiology ?No results found. ? ?Procedures ?Marland KitchenSuture Removal ? ?Date/Time: 01/29/2022 6:37 PM ?Performed by: Loni Beckwith, PA-C ?Authorized by: Loni Beckwith, PA-C  ? ?Consent:  ?  Consent obtained:  Verbal ?  Consent given by:  Patient ?  Risks discussed:  Bleeding, pain and wound separation ?  Alternatives discussed:  No  treatment ?Universal protocol:  ?  Procedure explained and questions answered to patient or proxy's satisfaction: yes   ?  Required blood products, implants, devices, and special equipment available: yes   ?  Immediately prior to procedure, a time out was called: yes   ?  Patient identity confirmed:  Verbally with patient and arm band ?Location:  ?  Location:  Upper extremity ?  Upper extremity location:  Hand ?  Hand location:  R small finger ?Procedure details:  ?  Wound appearance:  No signs of infection, good wound healing, clean, nonpurulent and nontender ?  Number of sutures removed:  3 ?Post-procedure details:  ?  Post-removal:  Band-Aid applied ?  Procedure completion:  Tolerated well, no immediate complications  ? ? ?Medications Ordered in ED ?Medications - No data to display ? ?ED Course/ Medical Decision Making/ A&P ?  ?                        ?Medical Decision Making ? ?Alert 26 year old female no acute distress, nontoxic-appearing.  Presents to ED for chief complaint of suture removal. ? ?Information obtained from patient and patient's brother at bedside.  Past medical records were reviewed including previous provider notes.  Patient has medical history as outlined in HPI which complicates her care. ? ?Wound shows no signs of infection.  Patient has full range of motion to affected digit.  Cap refill less than 2 seconds in affected digit.  Suture removal as indicated above.  Patient to follow-up with PCP as needed.  Due to patient's numbness she was offered information to follow-up with hand specialist however patient declines this information at this time. ? ?Based on patient's chief complaint, I considered admission might be necessary, however after reassuring ED workup feel patient is reasonable for discharge.  Discussed results, findings, treatment and follow up. Patient advised of return precautions. Patient verbalized understanding and agreed with plan. ? ?Portions of this note were generated with  [employer]. Dictation errors may occur despite best attempts at proofreading. ? ? ? ? ? ? ? ? ?Final Clinical Impression(s) / ED Diagnoses ?Final diagnoses:  ?None  ? ? ?Rx / DC Orders ?ED Discharge Orders   ? ? None  ? ?  ? ? ?  ?Loni Beckwith, PA-C ?01/29/22 1838 ? ?  ?Valarie Merino, MD ?01/29/22 2303 ? ?

## 2022-01-30 ENCOUNTER — Telehealth: Payer: Self-pay

## 2022-01-30 NOTE — Telephone Encounter (Signed)
Transition Care Management Unsuccessful Follow-up Telephone Call ? ?Date of discharge and from where:  01/29/2022 from [facility] ? ?Attempts:  1st Attempt ? ?Reason for unsuccessful TCM follow-up call:  Left voice message ? ? ? ?

## 2022-02-01 NOTE — Telephone Encounter (Signed)
Transition Care Management Unsuccessful Follow-up Telephone Call  Date of discharge and from where:  01/29/2022 from [facility]  Attempts:  2nd Attempt  Reason for unsuccessful TCM follow-up call:  Left voice message

## 2022-02-04 NOTE — Telephone Encounter (Signed)
Transition Care Management Unsuccessful Follow-up Telephone Call  Date of discharge and from where:  01/29/2022 from [facility]  Attempts:  3rd Attempt  Reason for unsuccessful TCM follow-up call:  Unable to reach patient

## 2022-02-05 ENCOUNTER — Other Ambulatory Visit (INDEPENDENT_AMBULATORY_CARE_PROVIDER_SITE_OTHER): Payer: Self-pay | Admitting: Primary Care

## 2022-02-05 DIAGNOSIS — F32A Depression, unspecified: Secondary | ICD-10-CM

## 2022-02-05 NOTE — Telephone Encounter (Signed)
Routed to PCP 

## 2022-03-07 ENCOUNTER — Ambulatory Visit: Payer: Medicaid Other | Admitting: Allergy & Immunology

## 2022-04-01 ENCOUNTER — Other Ambulatory Visit: Payer: Self-pay

## 2022-04-01 ENCOUNTER — Ambulatory Visit (HOSPITAL_COMMUNITY)
Admission: EM | Admit: 2022-04-01 | Discharge: 2022-04-01 | Disposition: A | Discharge status: Home / Self Care | Payer: Medicaid Other | Attending: Family Medicine | Admitting: Family Medicine

## 2022-04-01 ENCOUNTER — Encounter (HOSPITAL_COMMUNITY): Payer: Self-pay | Admitting: Emergency Medicine

## 2022-04-01 DIAGNOSIS — N390 Urinary tract infection, site not specified: Secondary | ICD-10-CM

## 2022-04-01 DIAGNOSIS — R109 Unspecified abdominal pain: Secondary | ICD-10-CM | POA: Insufficient documentation

## 2022-04-01 DIAGNOSIS — M542 Cervicalgia: Secondary | ICD-10-CM | POA: Diagnosis not present

## 2022-04-01 LAB — POCT URINALYSIS DIPSTICK, ED / UC
Bilirubin Urine: NEGATIVE
Glucose, UA: NEGATIVE mg/dL
Ketones, ur: NEGATIVE mg/dL
Nitrite: POSITIVE — AB
Protein, ur: >=300 mg/dL — AB
Specific Gravity, Urine: >=1.03 (ref 1.005–1.030)
Urobilinogen, UA: 0.2 mg/dL (ref 0.0–1.0)
pH: 5.5 (ref 5.0–8.0)

## 2022-04-01 LAB — POC URINE PREG, ED: Preg Test, Ur: NEGATIVE

## 2022-04-01 MED ORDER — NITROFURANTOIN MONOHYD MACRO 100 MG PO CAPS: 100.0000 mg | ORAL_CAPSULE | Freq: Two times a day (BID) | ORAL | 0 refills | Status: AC

## 2022-04-01 NOTE — ED Provider Notes (Signed)
Elizabeth Howell    CSN: [id number] Arrival date & time: 04/01/22  1728      History   Chief Complaint Chief Complaint  Patient presents with   Abdominal Pain    HPI Elizabeth Howell is a 26 y.o. female.   HPI Patient presents for evaluation of abdominal pain and right sided neck pain x 3 days. Patient reports awakening with right neck pain. She did not injure her neck. She maintains full ROM of her neck.   Past Medical History:  Diagnosis Date   ADHD (attention deficit hyperactivity disorder)    no current med.   Asthma    Depression    Hidradenitis suppurativa of left axilla 07/2017   History of asthma    no current med.   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      Home Medications    Prior to Admission medications   Medication Sig Start Date End Date Taking? Authorizing Provider  nitrofurantoin, macrocrystal-monohydrate, (MACROBID) 100 MG capsule Take 1 capsule (100 mg total) by mouth 2 (two) times daily for 7 days. 04/01/22 04/08/22 Yes Scot Jun, FNP  diphenhydrAMINE (BENADRYL) 25 MG tablet Take 1 tablet (25 mg total) by mouth every 6 (six) hours  as needed. Patient not taking: Reported on 04/01/2022 11/28/21   Noemi Chapel A, NP  EPINEPHrine 0.3 mg/0.3 mL IJ SOAJ injection Inject 0.3 mg into the muscle as needed for anaphylaxis. 05/31/21   Suzy Bouchard, PA-C  famotidine (PEPCID) 20 MG tablet Take 1 tablet (20 mg total) by mouth 2 (two) times daily for 14 days. Patient not taking: Reported on 04/01/2022 11/28/21 12/12/21  Eulogio Bear, NP  fluticasone [facility]) 50 MCG/ACT nasal spray Place 1 spray into both nostrils daily. Patient not taking: Reported on 04/01/2022 11/12/21   Geryl Councilman L, PA  triamcinolone ointment (KENALOG) 0.1 % Apply 1 application topically 2 (two) times daily. Patient not taking: Reported on 12/04/2021 08/21/21   Valentina Shaggy, MD    Family History Family History  Problem Relation Age of Onset   Healthy Father    Hypertension Maternal Grandfather    Diabetes Other     Social History Social History   Tobacco Use   Smoking status: Never   Smokeless tobacco: Never  Vaping Use   Vaping Use: Never used  Substance Use Topics   Alcohol use: Yes    Comment: occ   Drug use: No     Allergies   Patient has no known allergies.   Review of Systems Review of Systems Pertinent negatives listed in HPI   Physical Exam Triage Vital Signs ED Triage Vitals  Enc Vitals Group  BP 04/01/22 1915 106/75     Pulse Rate 04/01/22 1915 84     Resp 04/01/22 1915 20     Temp 04/01/22 1915 98.7 F (37.1 C)     Temp Source 04/01/22 1915 Oral     SpO2 04/01/22 1915 97 %     Weight --      Height --      Head Circumference --      Peak Flow --      Pain Score 04/01/22 1912 8     Pain Loc --      Pain Edu? --      Excl. in [place]? --    No data found.  Updated Vital Signs BP 106/75 (BP Location: Left Arm) Comment (BP Location): large cuff  Pulse 84   Temp 98.7 F (37.1 C) (Oral)   Resp 20   LMP 03/16/2022   SpO2 97%   Visual Acuity Right Eye Distance:   Left Eye Distance:    Bilateral Distance:    Right Eye Near:   Left Eye Near:    Bilateral Near:     Physical Exam General appearance: Alert, well developed, well nourished, cooperative and no acute distress Head: Normocephalic, without obvious abnormality, atraumatic Heart: Rate and rhythm normal.   Respiratory: Respirations even and unlabored, normal respiratory rate CVA:  No Flank Pain Extremities: Cervical neck, normal range of motion, localized posterior lateral palpable tenderness, no swelling or deformity present on exam  Skin: Skin color, texture, turgor normal. No rashes seen  Psych: Appropriate mood and affect.   UC Treatments / Results  Labs (all labs ordered are listed, but only abnormal results are displayed) Labs Reviewed  POCT URINALYSIS DIPSTICK, ED / UC - Abnormal; Notable for the following components:      Result Value   Hgb urine dipstick LARGE (*)    Protein, ur >=300 (*)    Nitrite POSITIVE (*)    Leukocytes,Ua LARGE (*)    All other components within normal limits  URINE CULTURE  POC URINE PREG, ED    EKG   Radiology No results found.  Procedures Procedures (including critical care time)  Medications Ordered in UC Medications - No data to display  Initial Impression / Assessment and Plan / UC Course  I have reviewed the triage vital signs and the nursing notes.  Pertinent labs & imaging results that were available during my care of the patient were reviewed by me and considered in my medical decision making (see chart for details).    Acute Urinary Tract infection, urine culture pending.  Treating empirically with Macrobid BID x 10 DAYS while awaiting urine culture.  Neck pain, recommend OTC ibuprofen or Aleve and warm compresses. Hydrate well with fluids  Final Clinical Impressions(s) / UC Diagnoses   Final diagnoses:  Acute urinary tract infection  Abdominal pain, unspecified abdominal location  Neck pain on right side   Discharge Instructions   None     ED Prescriptions     Medication Sig Dispense Auth. Provider   nitrofurantoin, macrocrystal-monohydrate, (MACROBID) 100 MG capsule Take 1 capsule (100 mg total) by mouth 2 (two) times daily for 7 days. 14 capsule Scot Jun, FNP      PDMP not reviewed this encounter.   Scot Jun, FNP 04/02/22 1006

## 2022-04-01 NOTE — ED Triage Notes (Signed)
Woke around 3 pm today with right side of neck pain.  Patient can turn head to the left with no pain.  Turning to the right causes right neck pain.    Pain in lower abdominal area.  Last bm was today, no diarrhea.  Denies vaginal discharge.  Denies urinary symptoms

## 2022-04-04 LAB — URINE CULTURE
Culture: >=100000 — AB
Special Requests: NORMAL

## 2022-04-23 ENCOUNTER — Ambulatory Visit (INDEPENDENT_AMBULATORY_CARE_PROVIDER_SITE_OTHER): Payer: Medicaid Other | Admitting: Allergy & Immunology

## 2022-04-23 ENCOUNTER — Encounter: Payer: Self-pay | Admitting: Allergy & Immunology

## 2022-04-23 VITALS — BP 106/60 | HR 105 | Temp 98.2°F | Resp 16 | Wt 274.8 lb

## 2022-04-23 DIAGNOSIS — T7840XD Allergy, unspecified, subsequent encounter: Secondary | ICD-10-CM | POA: Diagnosis not present

## 2022-04-23 DIAGNOSIS — R21 Rash and other nonspecific skin eruption: Secondary | ICD-10-CM | POA: Diagnosis not present

## 2022-04-23 DIAGNOSIS — T63481D Toxic effect of venom of other arthropod, accidental (unintentional), subsequent encounter: Secondary | ICD-10-CM

## 2022-04-23 NOTE — Patient Instructions (Addendum)
1. Allergic reaction - still unknown trigger - I am going to guess that you reacted to uncooked shellfish protein that might have been present on the raw fish that was in your sushi. - However, once the shellfish is COOKED, the protein is broken down and you do not react to it (you DO tolerate shellfish cooked in multiple forms). - Definitely try the same sushi and have your EpiPen ready (for lunch, so you have time ot stay up to see how you do).  - EpiPen is up to date (call us when you need a refill).   2. Return in about 1 year (around 04/24/2023).    Please inform us of any Emergency Department visits, hospitalizations, or changes in symptoms. Call us before going to the ED for breathing or allergy symptoms since we might be able to fit you in for a sick visit. Feel free to contact us anytime with any questions, problems, or concerns.  It was a pleasure to see you again today!  Websites that have reliable patient information: 1. American Academy of Asthma, Allergy, and Immunology: www.aaaai.org 2. Food Allergy Research and Education (FARE): foodallergy.org 3. Mothers of Asthmatics: http://www.asthmacommunitynetwork.org 4. American College of Allergy, Asthma, and Immunology: www.acaai.org   COVID-19 Vaccine Information can be found at: ShippingScam.co.uk For questions related to vaccine distribution or appointments, please email [email or web address] or call [phone].   We realize that you might be concerned about having an allergic reaction to the COVID19 vaccines. To help with that concern, WE ARE OFFERING THE COVID19 VACCINES IN OUR OFFICE! Ask the front desk for dates!     "Like" [place] on Facebook and Instagram for our latest updates!      A healthy democracy works best when [employer] participate! Make sure you are registered to vote! If you have moved or changed any of your contact information, you will need to get this  updated before voting!  In some cases, you MAY be able to register to vote online: [email or web address]

## 2022-04-23 NOTE — Progress Notes (Signed)
FOLLOW UP  Date of Service/Encounter:  04/23/22   Assessment:   Allergic reaction - conjectured that she reacted to uncooked shellfish protein that might have been present on the raw fish that was in her sushi   Rash - unknown trigger  Stinging insect hypersensitivity (yellowjacket, paper wasp)   History of asthma as a child   Family of [place]   Plan/Recommendations:   1. Allergic reaction - still unknown trigger - I am going to guess that you reacted to uncooked shellfish protein that might have been present on the raw fish that was in your sushi. - However, once the shellfish is COOKED, the protein is broken down and you do not react to it (you DO tolerate shellfish cooked in multiple forms). - Definitely try the same sushi and have your EpiPen ready (for lunch, so you have time ot stay up to see how you do).  - EpiPen is up to date (call us when you need a refill).   2. Return in about 1 year (around 04/24/2023).     Subjective:   Elizabeth Howell is a 26 y.o. 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  Elizabeth Howell is a 26 y.o. female presenting for a follow up visit.  We last saw her in March 2023.  At that time, she had labs that were positive to yellowjacket.  We obtained a seafood panel and obtained a sesame IgE as  well.  She had that come back positive to the shellfish, although IgE levels were fairly low.  We recommended avoiding all seafood.  In the interim, she had a finger injury from a mandolin. She tried to catch it on the way down.   Since the last visit, she has not had any other reactions. Ever since that last reaction, she has not had any problems. She is now avoiding that place where she obtained to see if she is caused her reaction. Her sushi did not have any shellfish at all. She is eating all shellfish without a problem. She is now avoiding raw seafood.  She actually is eating shellfish without any problems at all.  Her first reaction was after eating an avacado toast with smoked salmon. She has tolerated avacado since that time without a problem.   She is no longer at [employer]. 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  She is considering going back to school to get a CNA degree.  Otherwise, there have been no changes to her past medical history, surgical history, family history, or social history.    Review of Systems  Constitutional: Negative.  Negative for chills, fever, malaise/fatigue and weight loss.  HENT: Negative.  Negative for congestion, ear discharge and ear pain.   Eyes:  Negative for  pain, discharge and redness.  Respiratory:  Negative for cough, sputum production, shortness of breath and wheezing.   Cardiovascular: Negative.  Negative for chest pain and palpitations.  Gastrointestinal:  Negative for abdominal pain, constipation, diarrhea, heartburn, nausea and vomiting.  Skin: Negative.  Negative for itching and rash.  Neurological:  Negative for dizziness and headaches.  Endo/Heme/Allergies:  Negative for environmental allergies. Does not bruise/bleed easily.       Objective:   Blood pressure 106/60, pulse (!) 105, temperature 98.2 F (36.8 C), temperature  source Temporal, resp. rate 16, weight 274 lb 12.8 oz (124.6 kg), last menstrual period 03/16/2022, SpO2 97 %. Body mass index is 50.26 kg/m.    Physical Exam Vitals reviewed.  Constitutional:      Appearance: She is well-developed. She is obese.     Comments: Very talkative.  Pleasant.  HENT:     Head: Normocephalic and atraumatic.     Right Ear: Tympanic membrane, ear canal and external ear normal. No drainage, swelling or tenderness. Tympanic membrane is not injected, scarred, erythematous, retracted or bulging.     Left Ear: Tympanic membrane, ear canal and external ear normal. No drainage, swelling or tenderness. Tympanic membrane is not injected, scarred, erythematous, retracted or bulging.     Nose: No nasal deformity, septal deviation, mucosal edema or rhinorrhea.     Right Turbinates: Enlarged, swollen and pale.     Left Turbinates: Enlarged, swollen and pale.     Right Sinus: No maxillary sinus tenderness or frontal sinus tenderness.     Left Sinus: No maxillary sinus tenderness or frontal sinus tenderness.     Mouth/Throat:     Mouth: Mucous membranes are not pale and not dry.     Pharynx: Uvula midline.  Eyes:     General:        Right eye: No discharge.        Left eye: No discharge.     Conjunctiva/sclera: Conjunctivae normal.     Right eye: Right conjunctiva is not injected. No chemosis.    Left eye: Left conjunctiva is not injected. No chemosis.    Pupils: Pupils are equal, round, and reactive to light.  Cardiovascular:     Rate and Rhythm: Normal rate and regular rhythm.     Heart sounds: Normal heart sounds.  Pulmonary:     Effort: Pulmonary effort is normal. No tachypnea, accessory muscle usage or respiratory distress.     Breath sounds: Normal breath sounds. No wheezing, rhonchi or rales.     Comments: Moving air well in all lung fields.  No increased work of breathing. Chest:     Chest wall: No tenderness.  Abdominal:     Tenderness: There is no abdominal  tenderness. There is no guarding or rebound.  Lymphadenopathy:     Head:     Right side of head: No submandibular, tonsillar or occipital adenopathy.     Left side of head: No submandibular, tonsillar or occipital adenopathy.     Cervical: No cervical adenopathy.  Skin:    General: Skin is warm.     Capillary Refill: Capillary refill takes less than 2 seconds.     Coloration: Skin is not pale.     Findings: No abrasion, erythema, petechiae or rash. Rash is not papular, urticarial or vesicular.     Comments: No eczematous or urticarial lesions noted.  She does have what appear to be hyperpigmented lesions on her bilateral arms.  They are somewhat circular in appearance.  There are some excoriations present.  Neurological:     Mental Status: She is alert.  Psychiatric:        Behavior: Behavior is cooperative.      Diagnostic studies: none       Salvatore Marvel, MD  [facility]

## 2022-05-12 ENCOUNTER — Other Ambulatory Visit (INDEPENDENT_AMBULATORY_CARE_PROVIDER_SITE_OTHER): Payer: Self-pay | Admitting: Primary Care

## 2022-05-12 DIAGNOSIS — E782 Mixed hyperlipidemia: Secondary | ICD-10-CM

## 2022-07-22 ENCOUNTER — Telehealth (INDEPENDENT_AMBULATORY_CARE_PROVIDER_SITE_OTHER): Payer: Self-pay | Admitting: Licensed Clinical Social Worker

## 2022-07-22 ENCOUNTER — Telehealth: Payer: Self-pay | Admitting: Primary Care

## 2022-07-22 NOTE — Telephone Encounter (Signed)
Copied from [place] [id number]. Topic: Referral - Request for Referral >> Jul 22, 2022  5:15 PM Sabas Sous wrote: Has patient seen PCP for this complaint? Yes.   *If NO, is insurance requiring patient see PCP for this issue before PCP can refer them? Referral for which specialty: Dermatology  Preferred provider/office: Highest recommended  Reason for referral: Dandruff issues

## 2022-07-22 NOTE — Telephone Encounter (Signed)
Copied from [place] [id number]. Topic: Referral - Request for Referral >> Jul 22, 2022  5:17 PM Sabas Sous wrote: Reason for CRM: Wants to resume counseling/therapy sessions  Best contact: [phone]

## 2022-07-23 NOTE — Telephone Encounter (Signed)
LM on patients VM that she will need an office visit.

## 2022-07-30 NOTE — Telephone Encounter (Signed)
LCSWA called patient today to introduce herself and to assess patients' mental health needs. Patient did not answer the phone. LCSWA was able to leave a brief message with the patient asking them to return the call. Patient contacted LCSWA in regards to wanting therapy.

## 2022-07-31 ENCOUNTER — Ambulatory Visit (INDEPENDENT_AMBULATORY_CARE_PROVIDER_SITE_OTHER): Payer: Medicaid Other | Admitting: Primary Care

## 2022-07-31 ENCOUNTER — Encounter (INDEPENDENT_AMBULATORY_CARE_PROVIDER_SITE_OTHER): Payer: Self-pay | Admitting: Primary Care

## 2022-07-31 ENCOUNTER — Telehealth (INDEPENDENT_AMBULATORY_CARE_PROVIDER_SITE_OTHER): Payer: Self-pay | Admitting: Primary Care

## 2022-07-31 VITALS — BP 109/74 | HR 98 | Resp 16 | Ht 62.0 in | Wt 271.2 lb

## 2022-07-31 DIAGNOSIS — L732 Hidradenitis suppurativa: Secondary | ICD-10-CM

## 2022-07-31 DIAGNOSIS — L309 Dermatitis, unspecified: Secondary | ICD-10-CM

## 2022-07-31 NOTE — Progress Notes (Signed)
Elizabeth Howell, is a 26 y.o. female  JOA:[id number]  SWF:[id number]  DOB - 01/25/1996  Chief Complaint  Patient presents with   Referral    Dermatology        Subjective:   Elizabeth Howell is a 26 y.o. morbid obese female here today for a acute visit. She has complaints of flay dandruff recommended by hair dresser to see a dermatologist and she already has Hydradenitis . Patient has No headache, No chest pain, No abdominal pain - No Nausea, No new weakness tingling or numbness, No Cough - shortness of breath  No problems updated.  No Known Allergies  Past Medical History:  Diagnosis Date   ADHD (attention deficit hyperactivity disorder)    no current med.   Asthma    Depression    Hidradenitis suppurativa of left axilla 07/2017   History of asthma    no current med.   Hyperlipidemia    Nocturia    Pre-diabetes     Current Outpatient Medications on File Prior to Visit  Medication Sig Dispense Refill   atorvastatin (LIPITOR) 40 MG tablet Take 40 mg by mouth daily.     diphenhydrAMINE (BENADRYL) 25 MG tablet Take 1 tablet (25 mg total) by mouth every 6 (six) hours as needed. (Patient not taking: Reported on 04/01/2022) 30 tablet 0   EPINEPHrine 0.3 mg/0.3 mL IJ SOAJ injection Inject 0.3 mg into the muscle as needed for anaphylaxis. 1 each 0   famotidine (PEPCID) 20 MG tablet Take 1 tablet (20 mg total) by mouth 2 (two) times daily for 14 days. (Patient not taking: Reported on 04/01/2022) 28 tablet 0   fluticasone (FLONASE) 50 MCG/ACT nasal spray Place 1 spray into both nostrils daily. 16 g 0   triamcinolone ointment (KENALOG) 0.1 % Apply 1 application topically 2 (two) times daily. 30 g 5   No current facility-administered medications on file prior to visit.   Comprehensive ROS Pertinent positive and negative noted in HPI   Objective:   Vitals:   07/31/22 1107  BP: 109/74  Pulse: 98  Resp: 16  SpO2: 95%  Weight: 271 lb 3.2 oz  (123 kg)  Height: '5\' 2"'$  (1.575 m)    Exam General appearance : Awake, alert, not in any distress. Speech Clear. Not toxic looking HEENT: Atraumatic and Normocephalic, pupils equally reactive to light and accomodation Neck: Supple, no JVD. No cervical lymphadenopathy.  Chest: Good air entry bilaterally, no added sounds  CVS: S1 S2 regular, no murmurs.  Abdomen: Bowel sounds present, Non tender and not distended with no gaurding, rigidity or rebound. Extremities: B/L Lower Ext shows no edema, both legs are warm to touch Neurology: Awake alert, and oriented X 3, CN II-XII intact, Non focal Skin: No Rash  Data Review Lab Results  Component Value Date   HGBA1C 5.8 (A) 05/07/2021   HGBA1C 5.7 (A) 03/10/2020   HGBA1C 5.6 08/04/2019    Assessment & Plan   Lindy was seen today for referral.  Diagnoses and all orders for this visit:  Hydradenitis -     Ambulatory referral to Dermatology  Dermatitis due to unknown cause -     Ambulatory referral to Dermatology  Morbid obesity ([place]) -     Amb ref to Medical Nutrition Therapy-MNT Patient have been counseled extensively about nutrition and exercise. Other issues discussed during this visit include: low cholesterol diet, weight control and daily exercise, foot care, annual eye examinations at Ophthalmology, importance of adherence with medications  and regular follow-up. We also discussed Elizabeth term complications of uncontrolled diabetes and hypertension.   No follow-ups on file.  The patient was given clear instructions to go to ER or return to medical center if symptoms don't improve, worsen or new problems develop. The patient verbalized understanding. The patient was told to call to get lab results if they haven't heard anything in the next week.   This note has been created with Surveyor, quantity. Any transcriptional errors are unintentional.   Kerin Perna, NP 07/31/2022, 11:27  AM

## 2022-07-31 NOTE — Telephone Encounter (Signed)
Will forward to provider  

## 2022-07-31 NOTE — Telephone Encounter (Signed)
Patient called in states, doesn't need referral for nutrionist.

## 2022-08-01 NOTE — Telephone Encounter (Signed)
Call patient reminded her of writer's conversation about age and weight and risks of comorbidities.  Patient recalls the conversation and revisit weight loss next year.  And referral was made to dermatologist as requested.

## 2022-11-08 ENCOUNTER — Telehealth (INDEPENDENT_AMBULATORY_CARE_PROVIDER_SITE_OTHER): Payer: Self-pay

## 2022-11-08 ENCOUNTER — Other Ambulatory Visit (INDEPENDENT_AMBULATORY_CARE_PROVIDER_SITE_OTHER): Payer: Self-pay | Admitting: Primary Care

## 2022-11-08 NOTE — Telephone Encounter (Signed)
Copied from [place] [phone]. Topic: Referral - Request for Referral >> Nov 08, 2022 12:22 PM Everette C wrote: Has patient seen PCP for this complaint? Yes.   *If NO, is insurance requiring patient see PCP for this issue before PCP can refer them? Referral for which specialty: Behavioral Health  Preferred provider/office: Patient has no preference  Reason for referral: mental health wellness

## 2022-11-08 NOTE — Telephone Encounter (Signed)
Will forward to provider for referral.

## 2022-11-08 NOTE — Telephone Encounter (Signed)
Requested medication (s) are due for refill today: Yes  Requested medication (s) are on the active medication list: Yes  Last refill:  05/31/21  Future visit scheduled: No  Notes to clinic:  Unable to refill per protocol, last refill by another provider.      Requested Prescriptions  Pending Prescriptions Disp Refills   EPINEPHrine 0.3 mg/0.3 mL IJ SOAJ injection 1 each 0    Sig: Inject 0.3 mg into the muscle as needed for anaphylaxis.     Immunology: Antidotes Passed - 11/08/2022  3:59 PM      Passed - Valid encounter within last 12 months    Recent Outpatient Visits           3 months ago Hydradenitis   [place], Michelle P, NP   1 year ago Need for immunization against influenza   [place] [facility] Kerin Perna, NP   1 year ago Prediabetes   [place] Kerin Perna, NP   2 years ago Annual physical exam   [place] Charlott Rakes, MD   2 years ago Prediabetes   [place] Kerin Perna, NP

## 2022-11-08 NOTE — Telephone Encounter (Signed)
Medication Refill - Medication: EPINEPHrine 0.3 mg/0.3 mL IJ SOAJ injection   Stated hasn't had an allergic reaction; however, her current pens expired on 04/2022.  Has the patient contacted their pharmacy? No. No, more refills.  (Agent: If no, request that the patient contact the pharmacy for the refill. If patient does not wish to contact the pharmacy document the reason why and proceed with request.)   Preferred Pharmacy (with phone number or street name):  [id number] [place]  [id number] Phone: [phone]: [phone] Hours: Not open 24 hours   Has the patient been seen for an appointment in the last year OR does the patient have an upcoming appointment? No.  Agent: Please be advised that RX refills may take up to 3 business days. We ask that you follow-up with your pharmacy.

## 2022-11-14 ENCOUNTER — Other Ambulatory Visit (INDEPENDENT_AMBULATORY_CARE_PROVIDER_SITE_OTHER): Payer: Self-pay | Admitting: Primary Care

## 2022-11-14 DIAGNOSIS — F419 Anxiety disorder, unspecified: Secondary | ICD-10-CM

## 2022-11-14 MED ORDER — EPINEPHRINE 0.3 MG/0.3ML IJ SOAJ: 0.3000 mg | INTRAMUSCULAR | 0 refills | Status: DC | PRN

## 2022-11-19 DIAGNOSIS — L219 Seborrheic dermatitis, unspecified: Secondary | ICD-10-CM | POA: Diagnosis not present

## 2022-11-21 ENCOUNTER — Encounter (INDEPENDENT_AMBULATORY_CARE_PROVIDER_SITE_OTHER): Payer: Self-pay

## 2022-11-21 NOTE — Telephone Encounter (Signed)
LCSWA called patient today to introduce herself and to assess patients' mental health needs. Patient was referred by PCP for depression. Patient said she wants to find a therapist. LCSWA sent pt. resources through [employer]   [email or web address]  [facility] [address], [place], [place] [address] [phone] or [phone] Walk-in urgent care 24/7 for anyone  For [facility] ONLY New patient assessments and therapy walk-ins: Monday and Wednesday 8am-11am First and second Friday 1pm-5pm New patient psychiatry and medication management walk-ins: Mondays, Wednesdays, Thursdays, Fridays 8am-11am No psychiatry walk-ins on Tuesday   *Accepts all insurance and uninsured for Urgent Care needs *Accepts Medicaid and uninsured for outpatient treatment   [facility] (Therapy and psychiatry) [facility] (near [place]) [address], [place], [place] [address] [phone] Fax: [phone] ([place])   [facility] [place],  [place]  [address] [phone] Call for appointment  [facility] (Therapy only)  [facility] [address] [place], [place] [address] Monday - Friday: 8:30 a.m.-12 p.m. / 1 p.m.-2:30 p.m.  [facility] [address] [place], [place] [address] Monday-Friday: 8:30 a.m.-12 p.m. / 2-3:30 p.m. (INSURANCE REQUIRED -MEDICAID ACCEPTED) They do offer a sliding fee scale $20-$30/session   [facility] [place], [place] [address] Phone: [place] [address] [place] [address]  Phone: [phone] (Does not accept Medicaid) (only one provider accepts Medicare)  [facility] [address] (at  [employer])??[address]? [place], [place] [address] (Accepts Medicaid and Medicare)  [facility]) [place] [address]  [place], [place] [address]  Phone: [phone]  [address] [place], [place] [address] Phone: [phone] [facility] Medicaid) [facility] (Therapy only)  [address] [place], [place] [address] Phone: [phone]   [facility] (Therapy only)  [place], [place] [address] Phone: [phone] [facility] & Medicare)   [facility] [address], [place], [place] [address] Phone: [phone] ([place]) [facility] [phone] [address] [place], [place] [address] Phone: [phone] [facility]) [facility] (Psychiatry only)  [phone] [address], [place], [place] [address] (Accepts Medicaid and Medicare) [facility] (Psychiatry and therapy)  [place], [place], [place] [address] [phone] [facility] Medicare) [place] (psychiatry and therapy) [address], [place], [place] [address] [phone]  [facility] at [address], [place], [place], [place] [address] [phone] Accept [address], [place], [place], [place], [place],  and the following types of Medicaid; Alliance, [place], Partners, [place], [place], [employer], Healthy [place], [place] Complete, and [place], as well as offering a Manufacturing systems engineer and private payment options. Provides In-Office Appointments, Virtual Appointments, and Phone Consultations Offers medication management for ages 30 years old and up, including,  Medication Management for Suboxone and [place] [phone] [address], [place], [place] [address] ([place] Medicaid and Medicare)         19.  [facility] (therapy only)  [address] [place],  [address]            [phone] (Accepts medicare) 20. [facility]  (  Suboxone and methodone) [phone] [address], [place], [place] [address] To Be Eligible for Opioid Treatment at [facility] you must be at least 25 years of age you have already tried other interventions that were not successful such as opioid detox, inpatient rehab for opioids, or outpatient counseling specifically for opioid dependency your [facility] drug test must be completely free of benzodiazepines (klonopin, xanax, valium, ativan, or other benz) you have reliable transportation to the [facility] clinic in [place] you recognize that counseling is a critical component of [facility]' Opioid Program and you agree to attend all required counseling sessions you are committed to total drug abstinence and will conscientiously strive to remain free of alcohol, marijuana, and other illicit substances while in treatment you desire a peaceful treatment atmosphere in which personal responsibility and respect toward staff and clients is the norm   21. [facility] [place], [place], [place] [address] Offers SAIOP (Substance Abuse Intensive Outpatient Program) [phone] 22. [facility] [address] [place], [place] [address] [phone] (Accepts medicare)  For those who are tech savvy, go on psychology today, type in your local city (i.e. [place]. [place]) and specify your insurance at the top of the screen after you search. (Medicaid if needed). You can also specify whether you are interested in therapy and psychiatry.  [email or web address]/us

## 2022-11-25 ENCOUNTER — Encounter: Payer: Medicaid Other | Attending: Primary Care | Admitting: Registered"

## 2022-11-25 DIAGNOSIS — E88819 Insulin resistance, unspecified: Secondary | ICD-10-CM | POA: Diagnosis not present

## 2022-11-25 DIAGNOSIS — Z713 Dietary counseling and surveillance: Secondary | ICD-10-CM | POA: Diagnosis not present

## 2022-11-25 DIAGNOSIS — E161 Other hypoglycemia: Secondary | ICD-10-CM | POA: Insufficient documentation

## 2022-11-25 NOTE — Progress Notes (Signed)
Medical Nutrition Therapy  Appointment Start time: [phone] Appointment End time:  0905  Primary concerns today: Pt would like guidance on how to make changes  Referral diagnosis: E66.01 Preferred learning style: no preference indicated Learning readiness: ready  NUTRITION ASSESSMENT  Anthropometrics  Not assessed this visit. Pt states she fluctuates between 260 and 270   Clinical Medical Hx: Hyperinsulemia, Insulin resistance, prediabetes Medications: reviewed Labs: A1c 5.8%,  Notable Signs/Symptoms: irregular sleep schedule  Lifestyle & Dietary Hx Pt reports she and her younger brother live with grandparents. Pt states her grandparents help provide transportation. Pt states she is working on getting a learners permit. Pt states she is looking for work. Pt states she enjoys working at the [employer] and is more active when she is working. Pt states she will start again in April  Pt reports irregular sleep pattern, stays up late, takes naps, no regular schedule  Late night snackers  2 miles (walk to [place])  1x/month, walking downtown,   Estimated daily fluid intake: 2-4 bottles (16 oz) Supplements: fish oil, vitamin D Sleep: random Stress / self-care: 3/10 Current average weekly physical activity:   Favorite meal: sushi, rice bowl salmon and avocodo Avoids some raw fish and shell fish due to allergies.  24-Hr Dietary Recall First Meal: grandfather cooks on Sun or [employer], eggs, OR waffle house hashbrowns, milk or orange juice Snack: chips (variety) Second Meal:  Snack: occasionally chocolate Third Meal: [place], spoonful of mashed potatoes, steak tips, salad, ranch dressing Snack: chocolate Beverages: water, sparkling water, occasional koolaid, Bottle Sprite 2x/week  Estimated Energy Needs Calories: not assessed  NUTRITION DIAGNOSIS  NB-1.1 Food and nutrition-related knowledge deficit As related to irregular eating/sleep patters.  As evidenced by  reported dietary intake & lifestyle.  NUTRITION INTERVENTION  Nutrition education (E-1) on the following topics:  MyPlate Protein with breakfast Sleep  Handouts Provided Include  MyPlate Grocery shopping list based on MyPlate  Learning Style & Readiness for Change Teaching method utilized: Visual & Auditory  Demonstrated degree of understanding via: Teach Back  Barriers to learning/adherence to lifestyle change: none  Goals Established by Pt Aim to have 3 balanced meals per day based on MyPlate guidance Be sure to have protein with breakfast Start working on getting into a better/consistent sleep routine   MONITORING & EVALUATION Dietary intake, weekly physical activity, and sleep in 6 weeks.

## 2022-11-25 NOTE — Patient Instructions (Signed)
MyPlate Protein with breakfast Sleep

## 2022-11-27 ENCOUNTER — Encounter: Payer: Self-pay | Admitting: Registered"

## 2022-12-09 ENCOUNTER — Encounter (HOSPITAL_COMMUNITY): Payer: Self-pay | Admitting: Emergency Medicine

## 2022-12-09 ENCOUNTER — Ambulatory Visit (HOSPITAL_COMMUNITY)
Admission: EM | Admit: 2022-12-09 | Discharge: 2022-12-09 | Disposition: A | Discharge status: Home / Self Care | Payer: Medicaid Other | Attending: Internal Medicine | Admitting: Internal Medicine

## 2022-12-09 DIAGNOSIS — H66001 Acute suppurative otitis media without spontaneous rupture of ear drum, right ear: Secondary | ICD-10-CM

## 2022-12-09 MED ORDER — AMOXICILLIN 875 MG PO TABS: 875.0000 mg | ORAL_TABLET | Freq: Two times a day (BID) | ORAL | 0 refills | Status: AC

## 2022-12-09 NOTE — Discharge Instructions (Signed)
Amoxicillin twice a day for 7 days to treat ear infection. Ibuprofen as needed for ear pain. Zyrtec to help dry up fluid behind the eardrum contributing to ear pressure.  If you develop any new or worsening symptoms or do not improve in the next 2 to 3 days, please return.  If your symptoms are severe, please go to the emergency room.  Follow-up with your primary care provider for further evaluation and management of your symptoms as well as ongoing wellness visits.  I hope you feel better!

## 2022-12-09 NOTE — ED Provider Notes (Signed)
[facility]    CSN: [id number] Arrival date & time: 12/09/22  1420      History   Chief Complaint Chief Complaint  Patient presents with   Otalgia    HPI Elizabeth Howell is a 27 y.o. female.   Patient presents to urgent care for evaluation of right ear pain and pressure that started yesterday.  Patient has been sick for a few days with viral URI symptoms (cough and nasal congestion).  She states she blew her nose yesterday and heard her ears pop.  Reports her left ear was able to go back to normal without pressure after blowing her nose, however her right ear has remained with lots of pressure and pain ever since blowing her nose yesterday.  No drainage, decreased hearing, tinnitus, fever/chills, dizziness, recent fall/trauma/injuries to the head, sore throat, or neck pain.  Denies chest pain, shortness of breath, heart palpitations, nausea, vomiting, rash, and recent antibiotic/steroid use.  She has been using over-the-counter medications to help with her symptoms without much relief.  The history is provided by the patient.  Otalgia   Past Medical History:  Diagnosis Date   ADHD (attention deficit hyperactivity disorder)    no current med.   Asthma    Depression    Hidradenitis suppurativa of left axilla 07/2017   History of asthma    no current med.   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      Home Medications    Prior to Admission medications   Medication Sig Start Date End Date Taking? Authorizing Provider  amoxicillin (AMOXIL) 875 MG tablet Take 1 tablet (875 mg total) by mouth 2 (two) times daily for 7 days. 12/09/22 12/16/22 Yes Talbot Grumbling, FNP  atorvastatin (LIPITOR) 40 MG tablet Take 40 mg by mouth daily. Patient not taking: Reported on 11/25/2022 02/05/22   [provider]  diphenhydrAMINE (BENADRYL) 25 MG tablet Take 1 tablet (25 mg total) by mouth every 6 (six) hours as needed. Patient not taking: Reported on 04/01/2022 11/28/21   Noemi [place] A, NP  EPINEPHrine 0.3 mg/0.3 mL IJ SOAJ injection Inject 0.3 mg into the muscle as needed for anaphylaxis. 11/14/22   Kerin Perna, NP  famotidine (PEPCID) 20 MG tablet Take 1 tablet (20 mg total) by mouth 2 (two) times daily for 14 days. Patient not taking: Reported on 04/01/2022 11/28/21 12/12/21  Eulogio Bear, NP  fluticasone [facility]) 50 MCG/ACT nasal spray Place 1 spray into both nostrils daily. Patient not taking: Reported on 11/25/2022 11/12/21   Geryl Councilman L, PA  Omega-3 Fatty Acids (FISH OIL PO) Take by mouth daily.    [provider]  triamcinolone ointment (KENALOG) 0.1 % Apply 1 application topically 2 (two) times daily. Patient not taking: Reported on 11/25/2022 08/21/21   Valentina Shaggy, MD  VITAMIN D PO Take by mouth.    [provider]    Family History Family History  Problem Relation Age of Onset   Healthy Father    Hypertension Maternal Grandfather    Diabetes Other     Social History Social History   Tobacco Use   Smoking status: Never   Smokeless tobacco: Never  Vaping  Use   Vaping Use: Never used  Substance Use Topics   Alcohol use: Yes    Comment: occ   Drug use: No     Allergies   Patient has no known allergies.   Review of Systems Review of Systems  HENT:  Positive for ear pain.   Per HPI   Physical Exam Triage Vital Signs ED Triage Vitals  Enc Vitals Group     BP 12/09/22 1551 (!) 129/95     Pulse Rate 12/09/22 1551 89     Resp 12/09/22 1551 16     Temp 12/09/22 1551 98.7 F (37.1 C)     Temp Source 12/09/22 1551 Oral     SpO2 12/09/22 1551 96 %     Weight --      Height --      Head Circumference --      Peak Flow --      Pain Score 12/09/22 1550 0     Pain Loc --      Pain Edu? --      Excl. in [place]? --    No data found.  Updated Vital Signs BP (!) 129/95 (BP Location: Left Arm)   Pulse 89   Temp 98.7 F (37.1 C) (Oral)   Resp 16   LMP 11/16/2022   SpO2 96%   Visual Acuity Right Eye Distance:   Left Eye Distance:   Bilateral Distance:    Right Eye Near:   Left Eye Near:    Bilateral Near:     Physical Exam Vitals and nursing note reviewed.  Constitutional:      Appearance: She is not ill-appearing or toxic-appearing.  HENT:     Head: Normocephalic and atraumatic.     Right Ear: Hearing, ear canal and external ear normal. Tenderness present. Tympanic membrane is erythematous and bulging. Tympanic membrane is not perforated.     Left Ear: Hearing, ear canal and external ear normal. Tympanic membrane is injected.     Nose: Nose normal.     Mouth/Throat:     Lips: Pink.     Mouth: Mucous membranes are moist. No injury.     Tongue: No lesions. Tongue does not deviate from midline.     Palate: No mass and lesions.     Pharynx: Oropharynx is clear. Uvula midline. No pharyngeal swelling, oropharyngeal exudate, posterior oropharyngeal erythema or uvula swelling.     Tonsils: No tonsillar exudate or tonsillar abscesses.  Eyes:     General: Lids are normal. Vision grossly intact. Gaze aligned appropriately.      Extraocular Movements: Extraocular movements intact.     Conjunctiva/sclera: Conjunctivae normal.  Cardiovascular:     Rate and Rhythm: Normal rate and regular rhythm.     Heart sounds: Normal heart sounds, S1 normal and S2 normal.  Pulmonary:     Effort: Pulmonary effort is normal. No respiratory distress.     Breath sounds: Normal breath  sounds and air entry.  Musculoskeletal:     Cervical back: Neck supple.  Skin:    General: Skin is warm and dry.     Capillary Refill: Capillary refill takes less than 2 seconds.     Findings: No rash.  Neurological:     General: No focal deficit present.     Mental Status: She is alert and oriented to person, place, and time. Mental status is at baseline.     Cranial Nerves: No dysarthria or facial asymmetry.  Psychiatric:        Mood and Affect: Mood normal.        Speech: Speech normal.        Behavior: Behavior normal.        Thought Content: Thought content normal.        Judgment: Judgment normal.      UC Treatments / Results  Labs (all labs ordered are listed, but only abnormal results are displayed) Labs Reviewed - No data to display  EKG   Radiology No results found.  Procedures Procedures (including critical care time)  Medications Ordered in UC Medications - No data to display  Initial Impression / Assessment and Plan / UC Course  I have reviewed the triage vital signs and the nursing notes.  Pertinent labs & imaging results that were available during my care of the patient were reviewed by me and considered in my medical decision making (see chart for details).  1.  Nonrecurrent acute suppurative otitis media of right ear without spontaneous rupture of tympanic membrane Amoxicillin sent to pharmacy to be taken twice daily for the next 7 days to treat your infection.  Ibuprofen and Tylenol may be used as needed for ear pressure and pain.  Zyrtec 10 mg over-the-counter may be used as needed for pressure and fluid buildup  behind the eardrum.  Advised use of over-the-counter medications for other viral URI symptoms.  No indication for imaging today based on stable cardiopulmonary exam and hemodynamically stable vital signs.  HEENT exam is stable.  She is agreeable with this plan.  Discussed physical exam and available lab work findings in clinic with patient.  Counseled patient regarding appropriate use of medications and potential side effects for all medications recommended or prescribed today. Discussed red flag signs and symptoms of worsening condition,when to call the PCP office, return to urgent care, and when to seek higher level of care in the emergency department. Patient verbalizes understanding and agreement with plan. All questions answered. Patient discharged in stable condition.    Final Clinical Impressions(s) / UC Diagnoses   Final diagnoses:  Non-recurrent acute suppurative otitis media of right ear without spontaneous rupture of tympanic membrane     Discharge Instructions      Amoxicillin twice a day for 7 days to treat ear infection. Ibuprofen as needed for ear pain. Zyrtec to help dry up fluid behind the eardrum contributing to ear pressure.  If you develop any new or worsening symptoms or do not improve in the next 2 to 3 days, please return.  If your symptoms are severe, please go to the emergency room.  Follow-up with your primary care provider for further evaluation and management of your symptoms as well as ongoing wellness visits.  I hope you feel better!   ED Prescriptions     Medication Sig Dispense Auth. Provider   amoxicillin (AMOXIL) 875 MG tablet Take 1 tablet (875 mg total) by mouth 2 (two) times daily for 7 days.  14 tablet Talbot Grumbling, FNP      PDMP not reviewed this encounter.   Talbot Grumbling, [place] 12/09/22 1645

## 2022-12-09 NOTE — ED Triage Notes (Signed)
Pt c/o right ear pressure since yesterday. Reports blew nose yesterday and ears popped but didn't go "back to normal".

## 2023-01-08 ENCOUNTER — Encounter: Payer: Medicaid Other | Attending: Primary Care | Admitting: Registered"

## 2023-01-08 ENCOUNTER — Encounter: Payer: Self-pay | Admitting: Registered"

## 2023-01-08 DIAGNOSIS — E88819 Insulin resistance, unspecified: Secondary | ICD-10-CM | POA: Insufficient documentation

## 2023-01-08 NOTE — Patient Instructions (Addendum)
Crucial Conversations is a great book  When feeling full but there is still a few bites left in meal, either put away for later or don't feel guilty if you throw a little food away.  Aim to get protein with your breakfast Ideas you came up with to go with breakfast: Egg whites, humus, cheese, black beans, eggs & tortilla or toast, peanut butter, tuna  Avoid staying up late

## 2023-01-08 NOTE — Progress Notes (Signed)
Medical Nutrition Therapy  Appointment Start time: 1017 Appointment End time:  1105  Primary concerns today: wants to be able to put together balanced healthy meals  Referral diagnosis: E66.01 Preferred learning style: no preference indicated Learning readiness: ready  NUTRITION ASSESSMENT  Anthropometrics  Not assessed this visit.   Clinical Medical Hx: Hyperinsulemia, Insulin resistance, prediabetes Medications: reviewed Labs: A1c 5.8%,  Notable Signs/Symptoms: irregular sleep schedule  Lifestyle & Dietary Hx Pt states she tried asparagus since last visit when her brother cooked a meal for the family.   Pt reports she has been working on putting less food on her plate, looking at serving sizes on packages to help gauge proper amounts.  Pt reports having difficulty throwing out food when she can tell she is full, doesn't want to waste it.  Estimated daily fluid intake: 2-4 bottles (16 oz) Supplements: fish oil, vitamin D Sleep: random Stress / self-care: 3/10 Current average weekly physical activity:   (From initial visit) Favorite meal: sushi, rice bowl salmon and avocodo Avoids some raw fish and shell fish due to allergies.  24-Hr Dietary Recall  not assessed this visit   Estimated Energy Needs Calories: not assessed  NUTRITION DIAGNOSIS  NB-1.1 Food and nutrition-related knowledge deficit As related to irregular eating/sleep patters.  As evidenced by reported dietary intake & lifestyle.  NUTRITION INTERVENTION  Nutrition education (E-1) on the following topics:  Protein with breakfast Setting boundaries with people Reminder about sleep Listening to body, working with your thoughts about food and avoid guilt  Handouts Provided Include  MyPlate with food lists on back Smoothie recipes Anatomy of a grain bowl  Learning Style & Readiness for Change Teaching method utilized: Visual & Auditory  Demonstrated degree of understanding via: Teach Back  Barriers to  learning/adherence to lifestyle change: none  Goals Established by Pt Crucial Conversations is a great book  When feeling full but there is still a few bites left in meal, either put away for later or don't feel guilty if you throw a little food away.  Aim to get protein with your breakfast Ideas you came up with to go with breakfast: Egg whites, humus, cheese, black beans, eggs & tortilla or toast, peanut butter, tuna  Avoid staying up late   MONITORING & EVALUATION Dietary intake, weekly physical activity, and sleep in 8 weeks.

## 2023-01-19 ENCOUNTER — Encounter (HOSPITAL_COMMUNITY): Payer: Self-pay

## 2023-01-19 ENCOUNTER — Ambulatory Visit (HOSPITAL_COMMUNITY)
Admission: EM | Admit: 2023-01-19 | Discharge: 2023-01-19 | Disposition: A | Discharge status: Home / Self Care | Payer: Medicaid Other | Attending: Emergency Medicine | Admitting: Emergency Medicine

## 2023-01-19 DIAGNOSIS — R220 Localized swelling, mass and lump, head: Secondary | ICD-10-CM

## 2023-01-19 MED ORDER — CETIRIZINE HCL 10 MG PO TABS: 10.0000 mg | ORAL_TABLET | Freq: Every day | ORAL | 2 refills | Status: AC

## 2023-01-19 NOTE — ED Provider Notes (Signed)
[facility]    CSN: [id number] Arrival date & time: 01/19/23  1703     History   Chief Complaint Chief Complaint  Patient presents with   Allergic Reaction    HPI Elizabeth Howell is a 27 y.o. female.  Here with upper lip swelling that began yesterday.  She woke up with the right side a little bit swollen.  Took a Benadryl.  Today it seems to be improving but still feels a little tight.  Denies any swelling elsewhere including tongue or throat.  No shortness of breath or trouble breathing.  Denies abdominal pain, NVD  No new medications, foods, products No known allergies. No history of this  Just wanted to be checked out  Past Medical History:  Diagnosis Date   ADHD (attention deficit hyperactivity disorder)    no current med.   Asthma    Depression    Hidradenitis suppurativa of left axilla 07/2017   History of asthma    no current med.   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      Home Medications    Prior to Admission medications   Medication Sig Start Date End Date Taking? Authorizing Provider  cetirizine (ZYRTEC ALLERGY)  10 MG tablet Take 1 tablet (10 mg total) by mouth daily. 01/19/23  Yes Amberlyn Martinezgarcia, Lurena Joiner, PA-C  EPINEPHrine 0.3 mg/0.3 mL IJ SOAJ injection Inject 0.3 mg into the muscle as needed for anaphylaxis. 11/14/22   Grayce Sessions, NP  Omega-3 Fatty Acids (FISH OIL PO) Take by mouth daily.    [provider]  VITAMIN D PO Take by mouth.    [provider]    Family History Family History  Problem Relation Age of Onset   Healthy Father    Hypertension Maternal Grandfather    Diabetes Other     Social History Social History   Tobacco Use   Smoking status: Never   Smokeless tobacco: Never  Vaping Use   Vaping Use: Never used  Substance Use Topics   Alcohol use: Yes    Comment: occ   Drug use: No     Allergies   Patient has no known allergies.   Review of Systems Review of Systems As per HPI  Physical Exam Triage Vital Signs ED Triage Vitals [01/19/23 1807]  Enc Vitals Group     BP (!) 99/57     Pulse Rate 92     Resp 16     Temp 98.7 F (37.1 C)     Temp Source Oral     SpO2 100 %     Weight      Height  Head Circumference      Peak Flow      Pain Score      Pain Loc      Pain Edu?      Excl. in GC?    No data found.  Updated Vital Signs BP (!) 99/57 (BP Location: Left Arm)   Pulse 92   Temp 98.7 F (37.1 C) (Oral)   Resp 16   LMP 01/15/2023   SpO2 100%     Physical Exam Vitals and nursing note reviewed.  Constitutional:      General: She is not in acute distress.    Appearance: Normal appearance. She is not ill-appearing.     Comments: Normal phonation   HENT:     Mouth/Throat:     Mouth: Mucous membranes are moist. No injury, lacerations, oral lesions or angioedema.     Pharynx: Oropharynx is clear.     Comments: Very minimal, almost unnoticeable, swelling of the right upper lip.  Nontender to touch.  No fluctuance or induration.  No skin changes.  No other facial swelling Eyes:     Conjunctiva/sclera: Conjunctivae normal.   Cardiovascular:     Rate and Rhythm: Normal rate and regular rhythm.     Heart sounds: Normal heart sounds.  Pulmonary:     Effort: Pulmonary effort is normal.     Breath sounds: Normal breath sounds.  Abdominal:     Tenderness: There is no abdominal tenderness.  Musculoskeletal:        General: Normal range of motion.     Cervical back: Normal range of motion.  Skin:    General: Skin is warm and dry.  Neurological:     Mental Status: She is alert and oriented to person, place, and time.    UC Treatments / Results  Labs (all labs ordered are listed, but only abnormal results are displayed) Labs Reviewed - No data to display  EKG   Radiology No results found.  Procedures Procedures (including critical care time)  Medications Ordered in UC Medications - No data to display  Initial Impression / Assessment and Plan / UC Course  I have reviewed the triage vital signs and the nursing notes.  Pertinent labs & imaging results that were available during my care of the patient were reviewed by me and considered in my medical decision making (see chart for details).  Well-appearing, lungs are clear, minimal swelling of the upper lip on exam.  Discussed using antihistamines like Zyrtec and Benadryl.  Went over other symptoms that would warrant return or eval in the emergency department.  Patient agreeable to plan.  No questions at this time  Final Clinical Impressions(s) / UC Diagnoses   Final diagnoses:  Swelling of upper lip     Discharge Instructions      I recommend to try taking once daily Zyrtec for the next few days  You can use Benadryl at nighttime since this makes you sleepy.  The Benadryl and Zyrtec should help to fully reduce any swelling.  Please monitor for any change or worsening of symptoms and be seen immediately if they occur    ED Prescriptions     Medication Sig Dispense Auth. Provider   cetirizine (ZYRTEC ALLERGY) 10 MG tablet Take 1 tablet (10  mg total) by mouth daily. 30 tablet Rahil Passey, Lurena Joiner, PA-C      PDMP not reviewed this encounter.   Kathrine Haddock 01/19/23 4010

## 2023-01-19 NOTE — ED Triage Notes (Signed)
Here for upper lip swelling and pain x 1 day. Took benadryl this morning.

## 2023-01-19 NOTE — Discharge Instructions (Signed)
I recommend to try taking once daily Zyrtec for the next few days  You can use Benadryl at nighttime since this makes you sleepy.  The Benadryl and Zyrtec should help to fully reduce any swelling.  Please monitor for any change or worsening of symptoms and be seen immediately if they occur

## 2023-03-10 ENCOUNTER — Ambulatory Visit: Payer: Medicaid Other | Admitting: Registered"

## 2023-05-12 ENCOUNTER — Ambulatory Visit: Payer: Medicaid Other | Admitting: Registered"

## 2023-06-19 ENCOUNTER — Ambulatory Visit
Admission: EM | Admit: 2023-06-19 | Discharge: 2023-06-19 | Disposition: A | Discharge status: Home / Self Care | Payer: Medicaid Other | Attending: Physician Assistant | Admitting: Physician Assistant

## 2023-06-19 ENCOUNTER — Other Ambulatory Visit: Payer: Self-pay

## 2023-06-19 ENCOUNTER — Encounter: Payer: Self-pay | Admitting: *Deleted

## 2023-06-19 DIAGNOSIS — T7840XA Allergy, unspecified, initial encounter: Secondary | ICD-10-CM

## 2023-06-19 MED ORDER — DIPHENHYDRAMINE HCL 50 MG/ML IJ SOLN
50.0000 mg | Freq: Once | INTRAMUSCULAR | Status: AC
  Administered 2023-06-19: 50 mg via INTRAMUSCULAR

## 2023-06-19 MED ORDER — METHYLPREDNISOLONE ACETATE 80 MG/ML IJ SUSP
80.0000 mg | Freq: Once | INTRAMUSCULAR | Status: AC
  Administered 2023-06-19: 80 mg via INTRAMUSCULAR

## 2023-06-19 MED ORDER — FAMOTIDINE 20 MG PO TABS
20.0000 mg | ORAL_TABLET | Freq: Every day | ORAL | Status: DC
  Administered 2023-06-19: 20 mg via ORAL

## 2023-06-19 NOTE — ED Notes (Addendum)
Pt reports she feels better. Hives resolving. Myers PA-C at bedside to assess. She has her own epi-pen with her. States her grandmother brought her here

## 2023-06-19 NOTE — ED Triage Notes (Signed)
Pt presents with hives and itching to face and arms- She ate salmon tacos this evening. States she has had 3 prior reactions to salmon. She thought she was only allergic to raw salmon. States she just got home from dinner and started itching. Erma Pinto, PA-C at bedside to assess pt upon arrival

## 2023-06-30 ENCOUNTER — Encounter: Payer: Self-pay | Admitting: Physician Assistant

## 2023-06-30 NOTE — ED Provider Notes (Signed)
[name]    CSN: [id number] Arrival date & time: 06/19/23  1937      History   Chief Complaint Chief Complaint  Patient presents with   Allergic Reaction    HPI Elizabeth Howell is a 27 y.o. female.   Patient here for possible allergic reaction that started within the last few hours. She reports that she ate salmon, which she has not had reaction to in the past unless it was raw. She reports after eating salmon she went home and started to develop hives to her face and body. She had increasing rash which brought her to urgent care today. She denies any shortness of breath, nausea or trouble swallowing. She does have epipen if she should need it.  The history is provided by the patient.  Allergic Reaction Presenting symptoms: rash   Presenting symptoms: no difficulty swallowing and no wheezing     Past Medical History:  Diagnosis Date   ADHD (attention deficit hyperactivity disorder)    no current med.   Asthma    Depression    Hidradenitis suppurativa of left axilla 07/2017   History of asthma    no current med.   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      Home Medications    Prior to Admission medications   Medication Sig Start Date End Date Taking? Authorizing Provider  EPINEPHrine 0.3 mg/0.3 mL IJ SOAJ injection Inject 0.3 mg into the muscle as needed for anaphylaxis. 11/14/22  Yes Grayce Sessions, NP  VITAMIN D PO Take by mouth.   Yes [provider]  cetirizine (ZYRTEC ALLERGY) 10 MG tablet Take 1 tablet (10 mg total) by mouth daily. 01/19/23   Rising, Lurena Joiner, PA-C  Omega-3 Fatty Acids (FISH OIL PO) Take by mouth daily.    [provider]    Family History Family History  Problem Relation Age of Onset   Healthy Father    Hypertension Maternal Grandfather    Diabetes Other     Social History Social History   Tobacco Use   Smoking status: Never   Smokeless tobacco: Never  Vaping Use   Vaping status: Never Used  Substance Use Topics   Alcohol use: Yes    Comment: occ   Drug use: No     Allergies   Salmon [fish oil]   Review of Systems Review of Systems  Constitutional:  Negative for chills and fever.  HENT:  Negative for facial swelling and trouble swallowing.   Eyes:  Negative for discharge and redness.  Respiratory:  Negative for shortness of breath and wheezing.   Gastrointestinal:  Negative for abdominal pain, nausea and  vomiting.  Skin:  Positive for rash.     Physical Exam Triage Vital Signs ED Triage Vitals  Encounter Vitals Group     BP 06/19/23 1941 (!) 142/96     Systolic BP Percentile --      Diastolic BP Percentile --      Pulse Rate 06/19/23 1941 (!) 121     Resp 06/19/23 1941 (!) 24     Temp 06/19/23 1941 98.8 F (37.1 C)     Temp Source 06/19/23 1941 Oral     SpO2 06/19/23 1941 99 %     Weight --      Height --      Head Circumference --      Peak Flow --      Pain Score 06/19/23 1955 4     Pain Loc --      Pain Education --      Exclude from Growth Chart --    No data found.  Updated Vital Signs BP 118/72 (BP Location:  Right Arm)   Pulse (!) 108   Temp 98.8 F (37.1 C) (Oral)   Resp 18   LMP 06/05/2023 (Approximate)   SpO2 100%      Physical Exam Vitals and nursing note reviewed.  Constitutional:      General: She is not in acute distress.    Appearance: Normal appearance. She is not ill-appearing.  HENT:     Head: Normocephalic and atraumatic.     Nose: Nose normal. No congestion or rhinorrhea.     Mouth/Throat:     Mouth: Mucous membranes are moist.     Pharynx: Oropharynx is clear. No posterior oropharyngeal erythema.  Eyes:     Conjunctiva/sclera: Conjunctivae normal.  Cardiovascular:     Rate and Rhythm: Normal rate.  Pulmonary:     Effort: Pulmonary effort is normal. No respiratory distress.     Breath sounds: Normal breath sounds. No wheezing, rhonchi or rales.  Skin:    Findings: Rash (diffuse hives to face, arms, chest- mildly erythematous) present.  Neurological:     Mental Status: She is alert.  Psychiatric:        Mood and Affect: Mood normal.        Behavior: Behavior normal.        Thought Content: Thought content normal.      UC Treatments / Results  Labs (all labs ordered are listed, but only abnormal results are displayed) Labs Reviewed - No data to display  EKG   Radiology No results found.  Procedures Procedures (including critical care time)  Medications Ordered in UC Medications  methylPREDNISolone acetate (DEPO-MEDROL) injection 80 mg (80 mg Intramuscular Given 06/19/23 1951)  diphenhydrAMINE (BENADRYL) injection 50 mg (50 mg Intramuscular Given 06/19/23 1950)    Initial Impression / Assessment and Plan / UC Course  I have reviewed the triage vital signs and the nursing notes.  Pertinent labs & imaging results that were available during my care of the patient were reviewed by me and considered in my medical decision making (see chart for details).   Injections of benadryl and methylprednisolone administered in office with improvement of symptoms.  Advised patient to continue to monitor and use epipen if needed- call 911 if symptoms begin to worsen. Patient expresses understanding. Advised she not eat salmon in any form in the future.     Final Clinical Impressions(s) / UC Diagnoses   Final diagnoses:  Allergic reaction, initial encounter   Discharge Instructions  None    ED Prescriptions   None    PDMP not reviewed this encounter.   Tomi Bamberger, PA-C 06/30/23 2123

## 2023-08-20 ENCOUNTER — Other Ambulatory Visit: Payer: Self-pay

## 2023-08-20 ENCOUNTER — Ambulatory Visit
Admission: EM | Admit: 2023-08-20 | Discharge: 2023-08-20 | Disposition: A | Discharge status: Home / Self Care | Payer: Medicaid Other | Attending: Family Medicine | Admitting: Family Medicine

## 2023-08-20 ENCOUNTER — Encounter: Payer: Self-pay | Admitting: *Deleted

## 2023-08-20 DIAGNOSIS — H66002 Acute suppurative otitis media without spontaneous rupture of ear drum, left ear: Secondary | ICD-10-CM | POA: Diagnosis not present

## 2023-08-20 MED ORDER — AMOXICILLIN 875 MG PO TABS: 875.0000 mg | ORAL_TABLET | Freq: Two times a day (BID) | ORAL | 0 refills | Status: AC

## 2023-08-20 NOTE — ED Triage Notes (Signed)
Pt reports recovering from URI. Now has pain in her right ear

## 2023-08-20 NOTE — ED Provider Notes (Signed)
[facility]   [id number] 08/20/23 Arrival Time: 1201  ASSESSMENT & PLAN:  1. Non-recurrent acute suppurative otitis media of left ear without spontaneous rupture of tympanic membrane    OTC symptom care as needed.  Discharge Medication List as of 08/20/2023  3:05 PM     START taking these medications   Details  amoxicillin (AMOXIL) 875 MG tablet Take 1 tablet (875 mg total) by mouth 2 (two) times daily for 7 days., Starting Wed 08/20/2023, Until Wed 08/27/2023, Normal         Follow-up Information     Grayce Sessions, NP.   Specialty: Internal Medicine Why: If worsening or failing to improve as anticipated. Contact information: [address] [place] [address] [phone]                 Reviewed expectations re: course of current medical issues. Questions answered. Outlined signs and symptoms indicating need for more acute intervention. Understanding verbalized. After Visit Summary given.   SUBJECTIVE: History from: Patient. Elizabeth Howell is a 27 y.o. female. Pt reports recovering from URI. Now has pain in her right ear Denies: fever and difficulty breathing. Normal PO intake without n/v/d.  OBJECTIVE:  Vitals:   08/20/23 1447  BP: (!) 104/58  Pulse: 99  Resp: 18  Temp: 98.4 F (36.9 C)  TempSrc: Oral  SpO2: 96%    General appearance: alert; no distress Eyes: PERRLA; EOMI; conjunctiva normal HENT: [place]; AT; with nasal congestion; L TM with erythema/bulging Neck: supple  Lungs: speaks full sentences without difficulty; unlabored Extremities: no edema Skin: warm and dry Neurologic: normal gait Psychological: alert and cooperative; normal mood and affect   Allergies  Allergen Reactions   Salmon [Fish Oil] Anaphylaxis    Past Medical History:  Diagnosis Date   ADHD (attention deficit hyperactivity disorder)    no current med.   Asthma    Depression    Hidradenitis suppurativa of left axilla 07/2017    History of asthma    no current med.   Hyperlipidemia    Nocturia    Pre-diabetes    Social History   Socioeconomic History   Marital status: Single    Spouse name: Not on file   Number of children: Not on file   Years of education: Not on file   Highest education level: Not on file  Occupational History   Not on file  Tobacco Use   Smoking status: Never   Smokeless tobacco: Never  Vaping Use   Vaping status: Never Used  Substance and Sexual Activity   Alcohol use: Yes    Comment: occ   Drug use: No   Sexual activity: Never    Birth control/protection: None  Other Topics Concern   Not on file  Social History Narrative   Is in 9th grade at [employer]   Social Determinants of Health   Financial Resource Strain: Not on file  Food Insecurity: No Food Insecurity (11/25/2022)   Hunger Vital Sign    Worried About Running Out of Food in the Last Year: Never true    Ran Out of Food in the Last Year: Never true  Transportation Needs: Not on file  Physical Activity: Not on file  Stress: Not on file  Social Connections: Not on file  Intimate Partner Violence: Not on file   Family History  Problem Relation Age of Onset   Healthy Father    Hypertension Maternal Grandfather    Diabetes Other  Past Surgical History:  Procedure Laterality Date   HYDRADENITIS EXCISION Left 07/30/2017   Procedure: EXCISION LEFT AXILLA HIDRADENITIS;  Surgeon: Harriette Bouillon, MD;  Location: [facility];  Service: General;  Laterality: Left;   WISDOM TOOTH EXTRACTION       Mardella Layman, MD 08/20/23 [id number]

## 2024-03-12 ENCOUNTER — Encounter (HOSPITAL_COMMUNITY): Payer: Self-pay

## 2024-03-12 ENCOUNTER — Other Ambulatory Visit: Payer: Self-pay

## 2024-03-12 ENCOUNTER — Emergency Department (HOSPITAL_COMMUNITY)
Admission: EM | Admit: 2024-03-12 | Discharge: 2024-03-12 | Disposition: A | Discharge status: Home / Self Care | Attending: Emergency Medicine | Admitting: Emergency Medicine

## 2024-03-12 DIAGNOSIS — J45909 Unspecified asthma, uncomplicated: Secondary | ICD-10-CM | POA: Diagnosis not present

## 2024-03-12 DIAGNOSIS — R002 Palpitations: Secondary | ICD-10-CM | POA: Diagnosis not present

## 2024-03-12 DIAGNOSIS — R519 Headache, unspecified: Secondary | ICD-10-CM | POA: Insufficient documentation

## 2024-03-12 LAB — COMPREHENSIVE METABOLIC PANEL WITH GFR
ALT: 16 U/L (ref 0–44)
AST: 25 U/L (ref 15–41)
Albumin: 3.5 g/dL (ref 3.5–5.0)
Alkaline Phosphatase: 83 U/L (ref 38–126)
Anion gap: 8 (ref 5–15)
BUN: 6 mg/dL (ref 6–20)
CO2: 23 mmol/L (ref 22–32)
Calcium: 8.8 mg/dL — ABNORMAL LOW (ref 8.9–10.3)
Chloride: 103 mmol/L (ref 98–111)
Creatinine, Ser: 0.85 mg/dL (ref 0.44–1.00)
GFR, Estimated: >60 mL/min (ref >60)
Glucose, Bld: 108 mg/dL — ABNORMAL HIGH (ref 70–99)
Potassium: 4.3 mmol/L (ref 3.5–5.1)
Sodium: 134 mmol/L — ABNORMAL LOW (ref 135–145)
Total Bilirubin: 0.9 mg/dL (ref 0.0–1.2)
Total Protein: 6.7 g/dL (ref 6.5–8.1)

## 2024-03-12 LAB — CBC WITH DIFFERENTIAL/PLATELET
Abs Immature Granulocytes: 0.04 10*3/uL (ref 0.00–0.07)
Basophils Absolute: 0 10*3/uL (ref 0.0–0.1)
Basophils Relative: 0 %
Eosinophils Absolute: 0.3 10*3/uL (ref 0.0–0.5)
Eosinophils Relative: 3 %
HCT: 39.6 % (ref 36.0–46.0)
Hemoglobin: 13.5 g/dL (ref 12.0–15.0)
Immature Granulocytes: 0 %
Lymphocytes Relative: 22 %
Lymphs Abs: 2.3 10*3/uL (ref 0.7–4.0)
MCH: 30.8 pg (ref 26.0–34.0)
MCHC: 34.1 g/dL (ref 30.0–36.0)
MCV: 90.4 fL (ref 80.0–100.0)
Monocytes Absolute: 0.4 10*3/uL (ref 0.1–1.0)
Monocytes Relative: 4 %
Neutro Abs: 7.5 10*3/uL (ref 1.7–7.7)
Neutrophils Relative %: 71 %
Platelets: 314 10*3/uL (ref 150–400)
RBC: 4.38 MIL/uL (ref 3.87–5.11)
RDW: 13 % (ref 11.5–15.5)
WBC: 10.6 10*3/uL — ABNORMAL HIGH (ref 4.0–10.5)
nRBC: 0 % (ref 0.0–0.2)

## 2024-03-12 LAB — HCG, QUANTITATIVE, PREGNANCY: hCG, Beta Chain, Quant, S: <1 m[IU]/mL (ref <5)

## 2024-03-12 MED ORDER — ONDANSETRON HCL 4 MG/2ML IJ SOLN
4.0000 mg | Freq: Once | INTRAMUSCULAR | Status: AC
  Administered 2024-03-12: 4 mg via INTRAVENOUS
  Filled 2024-03-12: qty 2

## 2024-03-12 MED ORDER — DEXAMETHASONE SODIUM PHOSPHATE 10 MG/ML IJ SOLN
10.0000 mg | Freq: Once | INTRAMUSCULAR | Status: AC
  Administered 2024-03-12: 10 mg via INTRAVENOUS
  Filled 2024-03-12: qty 1

## 2024-03-12 MED ORDER — SODIUM CHLORIDE 0.9 % IV BOLUS
1000.0000 mL | Freq: Once | INTRAVENOUS | Status: AC
  Administered 2024-03-12: 1000 mL via INTRAVENOUS

## 2024-03-12 MED ORDER — KETOROLAC TROMETHAMINE 15 MG/ML IJ SOLN
15.0000 mg | Freq: Once | INTRAMUSCULAR | Status: DC
  Filled 2024-03-12: qty 1

## 2024-03-12 NOTE — Discharge Instructions (Signed)
 Your labs are reassuring. Make sure you're staying hydrated throughout the day. If you feel dehydrated you can incorporate sports drinks like gatorade or pedialyte into your diet - these drinks have electrolytes in them.  Alternate between tylenol  500 mg and ibuprofen  600 mg every 4 hours as needed for headache.   Get help right away if: Your headache: Becomes severe quickly. Gets worse after moderate to intense physical activity. You have any of these symptoms: Repeated vomiting. Pain or stiffness in your neck. Changes to your vision. Pain in an eye or ear. Problems with speech. Muscular weakness or loss of muscle control. Loss of balance or coordination. You feel faint or pass out. You have confusion. You have a seizure.

## 2024-03-12 NOTE — ED Provider Notes (Signed)
 [facility] Provider Note   CSN: [id number] Arrival date & time: 03/12/24  9687     Patient presents with: Headache   Elizabeth Howell is a 28 y.o. female with a history of asthma, ADHD, prediabetes presents the ED today for headache.  Patient reports that yesterday she was outside most of the day and then when she got home around 1:00 she started having the back of the neck and the back of the head with associated nausea.  Also endorses an episode of palpitations without chest pain.  No vision changes, weakness, or lightheadedness.  Denies any abdominal pain, vomiting, or diarrhea.  States that symptoms have improved and now she just has some discomfort at the back of the neck. No additional complaints or concerns at this time.    Prior to Admission medications   Medication Sig Start Date End Date Taking? Authorizing Provider  cetirizine  (ZYRTEC  ALLERGY) 10 MG tablet Take 1 tablet (10 mg total) by mouth daily. 01/19/23   Rising, [place], PA-C  EPINEPHrine  0.3 mg/0.3 mL IJ SOAJ injection Inject 0.3 mg into the muscle as needed for anaphylaxis. 11/14/22   Celestia Rosaline SQUIBB, NP  Omega-3 Fatty Acids (FISH OIL  PO) Take by mouth daily.    [provider]  VITAMIN D  PO Take by mouth.    [provider]    Allergies: Garnell Gab ]    Review of Systems  Neurological:  Positive for headaches.  All other systems reviewed and are negative.   Updated Vital Signs BP 117/68   Pulse 96   Temp 97.9 F (36.6 C) (Oral)   Resp 20   Ht 5' 2 (1.575 m)   Wt 118.8 kg   LMP 02/27/2024 (Approximate)   SpO2 100%   BMI 47.92 kg/m   Physical Exam Vitals and nursing note reviewed.  Constitutional:      General: She is not in acute distress.    Appearance: Normal appearance.  HENT:     Head: Normocephalic and atraumatic.     Mouth/Throat:     Mouth: Mucous membranes are moist.   Eyes:     Conjunctiva/sclera: Conjunctivae  normal.     Pupils: Pupils are equal, round, and reactive to light.   Neck:     Comments: Paravertebral TTP. ROM of neck intact. Cardiovascular:     Rate and Rhythm: Normal rate and regular rhythm.     Pulses: Normal pulses.  Pulmonary:     Effort: Pulmonary effort is normal.     Breath sounds: Normal breath sounds.  Abdominal:     Palpations: Abdomen is soft.     Tenderness: There is no abdominal tenderness.   Musculoskeletal:        General: No tenderness. Normal range of motion.     Cervical back: Normal range of motion. No rigidity.   Skin:    General: Skin is warm and dry.     Findings: No rash.   Neurological:     General: No focal deficit present.     Mental Status: She is alert.     Sensory: No sensory deficit.     Motor: No weakness.   Psychiatric:        Mood and Affect: Mood normal.        Behavior: Behavior normal.    (all labs ordered are listed, but only abnormal results are displayed) Labs Reviewed  COMPREHENSIVE METABOLIC PANEL WITH GFR - Abnormal; Notable for the following components:  Result Value   Sodium 134 (*)    Glucose, Bld 108 (*)    Calcium  8.8 (*)    All other components within normal limits  CBC WITH DIFFERENTIAL/PLATELET - Abnormal; Notable for the following components:   WBC 10.6 (*)    All other components within normal limits  HCG, QUANTITATIVE, PREGNANCY    EKG: EKG Interpretation Date/Time:  Friday March 12 2024 09:03:49 EDT Ventricular Rate:  90 PR Interval:  145 QRS Duration:  88 QT Interval:  357 QTC Calculation: 437 R Axis:   44  Text Interpretation: Sinus rhythm Confirmed by Bernard Drivers ([id number]) on 03/12/2024 10:37:54 AM  Radiology: No results found.   Procedures   Medications Ordered in the ED  ketorolac (TORADOL) 15 MG/ML injection 15 mg (0 mg Intravenous Hold 03/12/24 1038)  sodium chloride 0.9 % bolus 1,000 mL (1,000 mLs Intravenous New Bag/Given 03/12/24 0920)  ondansetron  (ZOFRAN ) injection 4 mg (4 mg  Intravenous Given 03/12/24 0920)  dexamethasone  (DECADRON ) injection 10 mg (10 mg Intravenous Given 03/12/24 1006)                                    Medical Decision Making Amount and/or Complexity of Data Reviewed Labs: ordered.  Risk Prescription drug management.   This patient presents to the ED for concern of headache, this involves an extensive number of treatment options, and is a complaint that carries with it a high risk of complications and morbidity.   Differential diagnosis includes: cluster headache, tension headache, migraine, occipital neuralgia, dehydration, electrolyte derangement, AKI, etc. Low suspicion for meningitis - no fevers or sick symptoms. Patient maintains full ROM of her neck on exam.   Comorbidities  See HPI above   Additional History  Additional history obtained from prior records   Cardiac Monitoring / EKG  The patient was maintained on a cardiac monitor.  I personally viewed and interpreted the cardiac monitored which showed: sinus rhythm with a heart rate of 90 bpm.   Lab Tests  I ordered and personally interpreted labs.  The pertinent results include:   CMP and CBC are reassuring - no acute electrolyte derangement, AKI, infection, or anemia Negative pregnancy test   Problem List / ED Course / Critical Interventions / Medication Management  Patient reports neck pain and headache at the back of her head that began last night, after being outside all day. Endorses associated nausea and a brief episode of palpitations. No CP or SOB. No vision changes, dizziness, or weakness.  History of headaches in the past. Did not take anything for her symptoms PTA. Is concerned about dehydration/her symptoms being related to the heat.  I ordered medications including: NS, Decadron , Toradol and Zofran  for headache  Reevaluation of the patient after these medicines showed that the patient improved I have reviewed the patients home medicines and have  made adjustments as needed   Social Determinants of Health  Physical activity   Test / Admission - Considered  Discussed findings with patient. All questions were answered. She is stable and safe for discharge home. Return precautions given.    Final diagnoses:  Acute nonintractable headache, unspecified headache type    ED Discharge Orders     None          Waddell Sluder, PA-C 03/12/24 1251    Bernard Drivers, MD 03/15/24 1348

## 2024-03-12 NOTE — ED Triage Notes (Signed)
 Patient arrives POV  headache at the back of neck that started around midnight today; patient states headache radiates to the top of head. Also reports feeling nauseous; states she had 1 cocktail drink around 11p. Patient concerned for heat stroke; states she was out a good deal of the day.

## 2024-08-09 ENCOUNTER — Telehealth (INDEPENDENT_AMBULATORY_CARE_PROVIDER_SITE_OTHER): Payer: Self-pay | Admitting: Primary Care

## 2024-08-09 NOTE — Telephone Encounter (Signed)
 Called pt to remind them about appt. Pt did not answer but please advise pt about appt.

## 2024-08-10 ENCOUNTER — Encounter (INDEPENDENT_AMBULATORY_CARE_PROVIDER_SITE_OTHER): Payer: Self-pay | Admitting: Primary Care

## 2024-08-10 ENCOUNTER — Ambulatory Visit (INDEPENDENT_AMBULATORY_CARE_PROVIDER_SITE_OTHER): Admitting: Primary Care

## 2024-08-10 ENCOUNTER — Other Ambulatory Visit (HOSPITAL_COMMUNITY)
Admission: RE | Admit: 2024-08-10 | Discharge: 2024-08-10 | Disposition: A | Source: Ambulatory Visit | Attending: Primary Care | Admitting: Primary Care

## 2024-08-10 VITALS — BP 106/77 | HR 95 | Resp 16 | Ht 62.0 in | Wt 261.2 lb

## 2024-08-10 DIAGNOSIS — Z124 Encounter for screening for malignant neoplasm of cervix: Secondary | ICD-10-CM | POA: Insufficient documentation

## 2024-08-10 NOTE — Progress Notes (Signed)
 Complete physical exam  Patient: Elizabeth Howell   DOB: [birth year]   28 y.o. Female  MRN: [id number]  Subjective:    No chief complaint on file.   Elizabeth Howell is a 28 y.o. female who presents today for a complete physical exam. She reports consuming a general diet.Ball room dancing  . She generally feels fairly well. She reports sleeping well. She does not have additional problems to discuss today.    Most recent fall risk assessment:    11/25/2022    8:00 AM  Fall Risk   Falls in the past year? 0     Most recent depression screenings:    11/25/2022    8:00 AM 07/31/2022   11:07 AM  PHQ 2/9 Scores  PHQ - 2 Score 3 3  PHQ- 9 Score  9      Data saved with a previous flowsheet row definition    Vision:Not within last year   Patient Active Problem List   Diagnosis Date Noted   Anxiety and depression 01/14/2017   Dyslipidemia 11/19/2016   Morbid obesity (HCC) 11/25/2014   Insulin resistance 11/25/2014   Hyperinsulinemia 11/25/2014   Essential hypertension, benign 11/25/2014   Acanthosis nigricans, acquired 11/25/2014   Dyspepsia 11/25/2014   Goiter 11/25/2014   Thyroiditis, autoimmune 11/25/2014   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   Asthma    Depression    Hidradenitis suppurativa of left axilla 07/2017   History of asthma    no current med.   Hyperlipidemia    Nocturia    Pre-diabetes       Patient Care Team: Celestia Rosaline SQUIBB, NP as PCP - General (Internal Medicine)   Outpatient Medications Prior to Visit  Medication Sig   cetirizine  (ZYRTEC  ALLERGY) 10 MG tablet Take 1 tablet (10 mg total) by mouth daily.   EPINEPHrine  0.3 mg/0.3 mL IJ SOAJ injection Inject 0.3 mg into the muscle as needed for anaphylaxis.   Omega-3 Fatty Acids (FISH OIL  PO) Take by  mouth daily.   VITAMIN D  PO Take by mouth.   No facility-administered medications prior to visit.    ROS Comprehensive ROS Pertinent positive and negative noted in HPI see past  Objective:     There were no vitals taken for this visit. BP Readings from Last 3 Encounters:  08/10/24 106/77  03/12/24 (!) 116/54  08/20/23 (!) 104/58   Physical Exam  CONSTITUTIONAL: Well-developed, well-nourished female in no acute distress.  HENT:  Normocephalic, atraumatic, External right and left ear normal. Oropharynx is clear and moist EYES: Conjunctivae and EOM are normal. Pupils are equal, round, and reactive to light. No scleral icterus.  NECK: Normal range of motion, supple, no masses.  Normal thyroid .  SKIN: Skin is warm and dry. No rash noted. Not diaphoretic. No erythema. No pallor. NEUROLGIC: Alert and oriented to person, place, and time. Normal reflexes, muscle tone coordination. No cranial nerve deficit noted. PSYCHIATRIC: Normal mood and affect. Normal behavior. Normal judgment and thought content. CARDIOVASCULAR: Normal heart rate noted, regular rhythm RESPIRATORY: Clear to auscultation bilaterally. Effort and breath sounds normal, no problems with respiration noted. BREASTS: Symmetric in size. No masses, skin changes, nipple drainage, or lymphadenopathy. Taught SBE returned demonstration ABDOMEN: Soft, normal bowel sounds, no distention noted.  No tenderness, rebound or guarding.  PELVIC: Normal  appearing external genitalia; normal appearing vaginal mucosa and cervix.  No abnormal discharge noted.  Pap smear obtained.  Normal uterine size, no other palpable masses, no uterine or adnexal tenderness. MUSCULOSKELETAL: Normal range of motion. No tenderness.  No cyanosis, clubbing, or edema.  2+ distal pulses. No results found for any visits on 08/10/24.    Assessment & Plan:    Routine Health Maintenance and Physical Exam  Immunization History  Administered Date(s) Administered    Influenza,inj,Quad PF,6+ Mos 07/02/2017, 07/20/2018, 08/04/2019, 05/18/2020, 06/18/2021   Influenza-Unspecified 07/08/2016   Pneumococcal Conjugate-13 07/17/2017, 03/10/2020   Tdap 07/02/2017, 03/10/2020    Health Maintenance  Topic Date Due   HPV VACCINES (1 - 3-dose series) Never done   Diabetic kidney evaluation - Urine ACR  Never done   Hepatitis B Vaccines 19-59 Average Risk (1 of 3 - 19+ 3-dose series) Never done   Pneumococcal Vaccine (2 of 2 - PPSV23, PCV20, or PCV21) 05/05/2020   OPHTHALMOLOGY EXAM  10/13/2020   FOOT EXAM  03/10/2021   HEMOGLOBIN A1C  11/07/2021   Cervical Cancer Screening (Pap smear)  05/19/2023   Influenza Vaccine  04/16/2024   COVID-19 Vaccine (1 - 2025-26 season) Never done   Diabetic kidney evaluation - eGFR measurement  03/12/2025   DTaP/Tdap/Td (3 - Td or Tdap) 03/10/2030   Hepatitis C Screening  Completed   HIV Screening  Completed   Meningococcal B Vaccine  Aged Out    Discussed health benefits of physical activity, and encouraged her to engage in regular exercise appropriate for her age and condition.   Elizabeth was seen today for gynecologic exam.  Diagnoses and all orders for this visit:  Cervical cancer screening -     Cytology - PAP    [name], NP

## 2024-08-10 NOTE — Patient Instructions (Signed)
 Reviewed all forms of birth control options available including abstinence; over the counter/barrier methods; hormonal contraceptive medication including pill, patch, ring, injection,contraceptive implant; hormonal and nonhormonal IUDs; permanent sterilization options including vasectomy and the various tubal sterilization modalities. Risks and benefits reviewed. Questions will be answered.  Information was given to patient to review.

## 2024-08-16 LAB — CYTOLOGY - PAP
Comment: NEGATIVE
Diagnosis: NEGATIVE
High risk HPV: NEGATIVE

## 2024-08-23 ENCOUNTER — Ambulatory Visit (INDEPENDENT_AMBULATORY_CARE_PROVIDER_SITE_OTHER): Payer: Self-pay | Admitting: Primary Care

## 2024-09-01 ENCOUNTER — Encounter (INDEPENDENT_AMBULATORY_CARE_PROVIDER_SITE_OTHER): Payer: Self-pay

## 2024-09-20 ENCOUNTER — Telehealth (INDEPENDENT_AMBULATORY_CARE_PROVIDER_SITE_OTHER): Payer: Self-pay | Admitting: Primary Care

## 2024-09-20 NOTE — Telephone Encounter (Signed)
 Called pt to confirm appt. Pt did not answer and LVM

## 2024-09-21 ENCOUNTER — Encounter (INDEPENDENT_AMBULATORY_CARE_PROVIDER_SITE_OTHER): Payer: Self-pay | Admitting: Primary Care

## 2024-09-21 ENCOUNTER — Ambulatory Visit (INDEPENDENT_AMBULATORY_CARE_PROVIDER_SITE_OTHER): Admitting: Primary Care

## 2024-09-21 VITALS — BP 108/78 | HR 89 | Resp 16 | Ht 62.0 in | Wt 259.6 lb

## 2024-09-21 DIAGNOSIS — Z30011 Encounter for initial prescription of contraceptive pills: Secondary | ICD-10-CM | POA: Diagnosis not present

## 2024-09-21 DIAGNOSIS — R7303 Prediabetes: Secondary | ICD-10-CM | POA: Diagnosis not present

## 2024-09-21 DIAGNOSIS — Z87892 Personal history of anaphylaxis: Secondary | ICD-10-CM

## 2024-09-21 DIAGNOSIS — F418 Other specified anxiety disorders: Secondary | ICD-10-CM

## 2024-09-21 DIAGNOSIS — E785 Hyperlipidemia, unspecified: Secondary | ICD-10-CM

## 2024-09-21 DIAGNOSIS — T7840XD Allergy, unspecified, subsequent encounter: Secondary | ICD-10-CM

## 2024-09-21 DIAGNOSIS — F32A Depression, unspecified: Secondary | ICD-10-CM

## 2024-09-21 DIAGNOSIS — Z3202 Encounter for pregnancy test, result negative: Secondary | ICD-10-CM | POA: Diagnosis not present

## 2024-09-21 DIAGNOSIS — Z0184 Encounter for antibody response examination: Secondary | ICD-10-CM

## 2024-09-21 DIAGNOSIS — I10 Essential (primary) hypertension: Secondary | ICD-10-CM

## 2024-09-21 LAB — POCT URINE PREGNANCY: Preg Test, Ur: NEGATIVE

## 2024-09-21 MED ORDER — TRAZODONE HCL 50 MG PO TABS: 25.0000 mg | ORAL_TABLET | Freq: Every evening | ORAL | 0 refills | Status: DC | PRN

## 2024-09-21 MED ORDER — EPINEPHRINE 0.3 MG/0.3ML IJ SOAJ: 0.3000 mg | INTRAMUSCULAR | 0 refills | Status: AC | PRN

## 2024-09-21 MED ORDER — LO LOESTRIN FE 1 MG-10 MCG / 10 MCG PO TABS: 1.0000 | ORAL_TABLET | Freq: Every day | ORAL | 6 refills | Status: AC

## 2024-09-21 NOTE — Progress Notes (Signed)
 " [facility]  Elizabeth Howell, is a 29 y.o. female  RDW:[id number]  FMW:[id number]  DOB - [birth year]  Chief Complaint  Patient presents with   Depression    Pt states it has been a while since she has had counseling and she thinks she needs an anti-depressant    Weight Management Screening   Contraception       Subjective:   Elizabeth Howell is a 29 y.o. female here today for an acute visit.  Patient has several things that she would like to discuss depression-question how she felt about herself and she is happy with the way she looks however certain situations make her aware of her weight which causes said sadness.  She also want to discuss weight loss management.  Explained after management of depression and anxiety due to previous traumas.  Would refer her to therapy.  Then look at options for weight loss.  She is recently's involved in a relationship heterosexual female for GYN would like to be on birth control and prepared if the relationship things were the of her.    Depression         No problems updated.  Comprehensive ROS Pertinent positive and negative noted in HPI   Allergies[1]  Past Medical History:  Diagnosis Date   ADHD (attention deficit hyperactivity disorder)    no current med.   Asthma    Depression    Hidradenitis suppurativa of left axilla 07/2017   History of asthma    no current med.   Hyperlipidemia    Nocturia    Pre-diabetes     Medications Ordered Prior to Encounter[2] Health Maintenance  Topic Date Due   HPV Vaccine (1 - 3-dose series) Never done   Yearly kidney health urinalysis for diabetes  Never done   Hepatitis B Vaccine (1 of 3 - 19+ 3-dose series) Never done   Pneumococcal Vaccine (2 of 2 - PPSV23, PCV20, or PCV21) 05/05/2020   Eye exam for diabetics  10/13/2020   Complete foot exam   03/10/2021   Hemoglobin A1C  11/07/2021   Flu Shot  04/16/2024   COVID-19 Vaccine (1 - 2025-26 season) Never done   Yearly  kidney function blood test for diabetes  03/12/2025   Pap Smear  08/11/2027   DTaP/Tdap/Td vaccine (3 - Td or Tdap) 03/10/2030   Hepatitis C Screening  Completed   HIV Screening  Completed   Meningitis B Vaccine  Aged Out    Objective:   Vitals:   09/21/24 0843  BP: 108/78  Pulse: 89  Resp: 16  SpO2: 99%  Weight: 259 lb 9.6 oz (117.8 kg)  Height: 5' 2 (1.575 m)     Physical Exam Vitals reviewed.  Constitutional:      Appearance: Normal appearance. She is obese.  HENT:     Head: Normocephalic.     Right Ear: Tympanic membrane, ear canal and external ear normal.     Left Ear: Tympanic membrane, ear canal and external ear normal.     Nose: Nose normal.     Mouth/Throat:     Mouth: Mucous membranes are moist.  Eyes:     Extraocular Movements: Extraocular movements intact.     Pupils: Pupils are equal, round, and reactive to light.  Cardiovascular:     Rate and Rhythm: Normal rate.  Pulmonary:     Effort: Pulmonary effort is normal.     Breath sounds: Normal breath sounds.  Abdominal:     General: Bowel  sounds are normal.     Palpations: Abdomen is soft.  Musculoskeletal:        General: Normal range of motion.     Cervical back: Normal range of motion.  Skin:    General: Skin is warm and dry.  Neurological:     Mental Status: She is alert and oriented to person, place, and time.  Psychiatric:        Mood and Affect: Mood normal.        Behavior: Behavior normal.        Thought Content: Thought content normal.      Assessment & Plan  Elizabeth Howell was seen today for depression, weight management screening and contraception.  Diagnoses and all orders for this visit:  Immunity status testing -     Hepatitis B surface antibody,qualitative  Prediabetes -     CBC with Differential/Platelet -     Hemoglobin A1c -     Microalbumin / creatinine urine ratio  Essential hypertension, benign -     CMP14+EGFR  Morbid obesity (HCC) F/U with weight loss management    Dyslipidemia -     Lipid panel  Allergic reaction, subsequent encounter -     EPINEPHrine  0.3 mg/0.3 mL IJ SOAJ injection; Inject 0.3 mg into the muscle as needed for anaphylaxis.  Anxiety and depression -     AMB Referral VBCI Care Management  Other orders -     traZODone  (DESYREL ) 50 MG tablet; Take 0.5-1 tablets (25-50 mg total) by mouth at bedtime as needed for sleep.    Patient have been counseled extensively about nutrition and exercise. Other issues discussed during this visit include: low cholesterol diet, weight control and daily exercise, foot care, annual eye examinations at Ophthalmology, importance of adherence with medications and regular follow-up. We also discussed long term complications of uncontrolled diabetes and hypertension.   Return in about 6 weeks (around 11/02/2024) for medication evaluation.  The patient was given clear instructions to go to ER or return to medical center if symptoms don't improve, worsen or new problems develop. The patient verbalized understanding. The patient was told to call to get lab results if they haven't heard anything in the next week.   This note has been created with Education officer, environmental. Any transcriptional errors are unintentional.   Elizabeth SHAUNNA Bohr, NP 09/21/2024, 9:22 AM     [1]  Allergies Allergen Reactions   [name] ] Anaphylaxis  [2]  Current Outpatient Medications on File Prior to Visit  Medication Sig Dispense Refill   cetirizine  (ZYRTEC  ALLERGY) 10 MG tablet Take 1 tablet (10 mg total) by mouth daily. 30 tablet 2   Omega-3 Fatty Acids (FISH OIL  PO) Take by mouth daily.     VITAMIN D  PO Take by mouth.     No current facility-administered medications on file prior to visit.   "

## 2024-09-21 NOTE — Patient Instructions (Signed)
 Trazodone  Tablets What is this medication? TRAZODONE  (TRAZ oh done) treats depression. It increases the amount of serotonin in the brain, a substance that helps regulate mood. This medicine may be used for other purposes; ask your health care provider or pharmacist if you have questions. COMMON BRAND NAME(S): Desyrel  What should I tell my care team before I take this medication? They need to know if you have any of these conditions: Bipolar disorder Bleeding disorder Glaucoma Heart disease, or previous heart attack Irregular heartbeat or rhythm Kidney disease Liver disease Low levels of sodium in the blood Suicidal thoughts, plans, or attempt by you or a family member An unusual or allergic reaction to trazodone , other medications, foods, dyes, or preservatives Pregnant or trying to get pregnant Breastfeeding How should I use this medication? Take this medication by mouth with a glass of water. Take it as directed on the prescription label at the same time every day. Take this medication shortly after a meal or a light snack. Keep taking this medication unless your care team tells you to stop. Stopping it too quickly can cause serious side effects. It can also make your condition worse. A special MedGuide will be given to you by the pharmacist with each prescription and refill. Be sure to read this information carefully each time. Talk to your care team about the use of this medication in children. Special care may be needed. Overdosage: If you think you have taken too much of this medicine contact a poison control center or emergency room at once. NOTE: This medicine is only for you. Do not share this medicine with others. What if I miss a dose? If you miss a dose, take it as soon as you can. If it is almost time for your next dose, take only that dose. Do not take double or extra doses. What may interact with this medication? Do not take this medication with any of the  following: Certain medications for fungal infections, such as fluconazole, itraconazole, ketoconazole, posaconazole, voriconazole Cisapride Dronedarone Linezolid MAOIs, such as Carbex, Eldepryl, Marplan, Nardil, and Parnate Mesoridazine Methylene blue (injected into a vein) Pimozide Saquinavir Thioridazine This medication may also interact with the following: Alcohol Antiviral medications for HIV or AIDS Aspirin and aspirin-like medications Barbiturates, such as phenobarbital Certain medications for blood pressure, heart disease, irregular heart beat Certain medications for mental health conditions Certain medications for migraine headache, such as almotriptan, eletriptan, frovatriptan, naratriptan, rizatriptan , sumatriptan, zolmitriptan Certain medications for seizures, such as carbamazepine and phenytoin Certain medications for sleep Certain medications that treat or prevent blood clots, such as dalteparin, enoxaparin, warfarin Digoxin Fentanyl  Lithium NSAIDS, medications for pain and inflammation, such as ibuprofen  or naproxen Other medications that cause heart rhythm changes Rasagiline Supplements, such as St. John's wort, kava kava, valerian Tramadol Tryptophan This list may not describe all possible interactions. Give your health care provider a list of all the medicines, herbs, non-prescription drugs, or dietary supplements you use. Also tell them if you smoke, drink alcohol, or use illegal drugs. Some items may interact with your medicine. What should I watch for while using this medication? Visit your care team for regular checks on your progress. Tell your care team if your symptoms do not start to get better or if they get worse. Because it may take several weeks to see the full effects of this medication, it is important to continue your treatment as prescribed by your care team. Watch for new or worsening thoughts of suicide or depression. This  includes sudden changes  in mood, behaviors, or thoughts. These changes can happen at any time but are more common in the beginning of treatment or after a change in dose. Call your care team right away if you experience these thoughts or worsening depression. This medication may cause mood and behavior changes, such as anxiety, nervousness, irritability, hostility, restlessness, excitability, hyperactivity, or trouble sleeping. These changes can happen at any time but are more common in the beginning of treatment or after a change in dose. Call your care team right away if you notice any of these symptoms. This medication may affect your coordination, reaction time, or judgment. Do not drive or operate machinery until you know how this medication affects you. Sit up or stand slowly to reduce the risk of dizzy or fainting spells. Drinking alcohol with this medication can increase the risk of these side effects. This medication may cause dry eyes and blurred vision. If you wear contact lenses you may feel some discomfort. Lubricating drops may help. See your care team if the problem does not go away or is severe. Your mouth may get dry. Chewing sugarless gum or sucking hard candy and drinking plenty of water may help. Contact your care team if the problem does not go away or is severe. What side effects may I notice from receiving this medication? Side effects that you should report to your care team as soon as possible: Allergic reactions--skin rash, itching, hives, swelling of the face, lips, tongue, or throat Bleeding--bloody or black, tar-like stools, red or dark brown urine, vomiting blood or brown material that looks like coffee grounds, small, red or purple spots on skin, unusual bleeding or bruising Heart rhythm changes--fast or irregular heartbeat, dizziness, feeling faint or lightheaded, chest pain, trouble breathing Low blood pressure--dizziness, feeling faint or lightheaded, blurry vision Low sodium level--muscle  weakness, fatigue, dizziness, headache, confusion Prolonged or painful erection Serotonin syndrome--irritability, confusion, fast or irregular heartbeat, muscle stiffness, twitching muscles, sweating, high fever, seizures, chills, vomiting, diarrhea Sudden eye pain or change in vision such as blurry vision, seeing halos around lights, vision loss Thoughts of suicide or self-harm, worsening mood, feelings of depression Side effects that usually do not require medical attention (report to your care team if they continue or are bothersome): Change in sex drive or performance Constipation Dizziness Drowsiness Dry mouth This list may not describe all possible side effects. Call your doctor for medical advice about side effects. You may report side effects to FDA at 1-800-FDA-1088. Where should I keep my medication? Keep out of the reach of children and pets. Store at room temperature between 15 and 30 degrees C (59 to 86 degrees F). Protect from light. Keep container tightly closed. Throw away any unused medication after the expiration date. NOTE: This sheet is a summary. It may not cover all possible information. If you have questions about this medicine, talk to your doctor, pharmacist, or health care provider.  2025 Elsevier/Gold Standard (2023-11-19 00:00:00)

## 2024-09-22 LAB — HEMOGLOBIN A1C

## 2024-09-22 LAB — MICROALBUMIN / CREATININE URINE RATIO
Creatinine, Urine: 338.1 mg/dL
Microalb/Creat Ratio: 9 mg/g{creat} (ref 0–29)
Microalbumin, Urine: 31.9 ug/mL

## 2024-09-22 LAB — LIPID PANEL

## 2024-09-22 LAB — HEPATITIS B SURFACE ANTIBODY,QUALITATIVE

## 2024-09-22 LAB — CMP14+EGFR

## 2024-09-22 LAB — SPECIMEN STATUS REPORT

## 2024-09-22 LAB — CBC WITH DIFFERENTIAL/PLATELET

## 2024-09-27 ENCOUNTER — Ambulatory Visit (INDEPENDENT_AMBULATORY_CARE_PROVIDER_SITE_OTHER): Payer: Self-pay | Admitting: Primary Care

## 2024-09-29 ENCOUNTER — Ambulatory Visit (INDEPENDENT_AMBULATORY_CARE_PROVIDER_SITE_OTHER)

## 2024-09-29 ENCOUNTER — Telehealth: Payer: Self-pay | Admitting: *Deleted

## 2024-09-29 DIAGNOSIS — I10 Essential (primary) hypertension: Secondary | ICD-10-CM

## 2024-09-29 DIAGNOSIS — E785 Hyperlipidemia, unspecified: Secondary | ICD-10-CM

## 2024-09-29 DIAGNOSIS — Z0184 Encounter for antibody response examination: Secondary | ICD-10-CM

## 2024-09-29 DIAGNOSIS — R7303 Prediabetes: Secondary | ICD-10-CM

## 2024-09-29 NOTE — Progress Notes (Signed)
 Complex Care Management Note Care Guide Note  09/29/2024 Name: Elizabeth Howell MRN: [id number] DOB: [birth year]   Complex Care Management Outreach Attempts: An unsuccessful telephone outreach was attempted today to offer the patient information about available complex care management services.  Follow Up Plan:  Additional outreach attempts will be made to offer the patient complex care management information and services.   Encounter Outcome:  No Answer  Harlene Satterfield  [facility]  [facility], [facility] Guide  Direct Dial: [phone]  Fax [phone]

## 2024-09-30 LAB — CMP14+EGFR
ALT: 16 IU/L (ref 0–32)
AST: 14 IU/L (ref 0–40)
Albumin: 4.5 g/dL (ref 4.0–5.0)
Alkaline Phosphatase: 102 IU/L (ref 41–116)
BUN/Creatinine Ratio: 11 (ref 9–23)
BUN: 8 mg/dL (ref 6–20)
Bilirubin Total: 0.5 mg/dL (ref 0.0–1.2)
CO2: 23 mmol/L (ref 20–29)
Calcium: 9.6 mg/dL (ref 8.7–10.2)
Chloride: 101 mmol/L (ref 96–106)
Creatinine, Ser: 0.7 mg/dL (ref 0.57–1.00)
Globulin, Total: 3 g/dL (ref 1.5–4.5)
Glucose: 82 mg/dL (ref 70–99)
Potassium: 4.3 mmol/L (ref 3.5–5.2)
Sodium: 139 mmol/L (ref 134–144)
Total Protein: 7.5 g/dL (ref 6.0–8.5)
eGFR: 122 mL/min/1.73

## 2024-09-30 LAB — CBC WITH DIFFERENTIAL/PLATELET
Basophils Absolute: 0 x10E3/uL (ref 0.0–0.2)
Basos: 1 %
EOS (ABSOLUTE): 0.2 x10E3/uL (ref 0.0–0.4)
Eos: 3 %
Hematocrit: 43.7 % (ref 34.0–46.6)
Hemoglobin: 14.5 g/dL (ref 11.1–15.9)
Immature Grans (Abs): 0 x10E3/uL (ref 0.0–0.1)
Immature Granulocytes: 0 %
Lymphocytes Absolute: 1.8 x10E3/uL (ref 0.7–3.1)
Lymphs: 23 %
MCH: 30.1 pg (ref 26.6–33.0)
MCHC: 33.2 g/dL (ref 31.5–35.7)
MCV: 91 fL (ref 79–97)
Monocytes Absolute: 0.3 x10E3/uL (ref 0.1–0.9)
Monocytes: 4 %
Neutrophils Absolute: 5.5 x10E3/uL (ref 1.4–7.0)
Neutrophils: 69 %
Platelets: 320 x10E3/uL (ref 150–450)
RBC: 4.81 x10E6/uL (ref 3.77–5.28)
RDW: 13.2 % (ref 11.7–15.4)
WBC: 7.9 x10E3/uL (ref 3.4–10.8)

## 2024-09-30 LAB — LIPID PANEL
Chol/HDL Ratio: 6.3 ratio — ABNORMAL HIGH (ref 0.0–4.4)
Cholesterol, Total: 310 mg/dL — ABNORMAL HIGH (ref 100–199)
HDL: 49 mg/dL
LDL Chol Calc (NIH): 237 mg/dL — ABNORMAL HIGH (ref 0–99)
Triglycerides: 132 mg/dL (ref 0–149)
VLDL Cholesterol Cal: 24 mg/dL (ref 5–40)

## 2024-09-30 LAB — HEPATITIS B SURFACE ANTIBODY,QUALITATIVE: Hep B Surface Ab, Qual: NONREACTIVE

## 2024-09-30 LAB — HEMOGLOBIN A1C
Est. average glucose Bld gHb Est-mCnc: 120 mg/dL
Hgb A1c MFr Bld: 5.8 % — ABNORMAL HIGH (ref 4.8–5.6)

## 2024-10-01 NOTE — Progress Notes (Signed)
 Complex Care Management Care Guide Note  10/01/2024 Name: Elizabeth Howell MRN: [id number] DOB: June 14, 1996  Elizabeth Howell is a 29 y.o. year old female who is a primary care patient of Celestia Rosaline SQUIBB, NP and is actively engaged with the care management team. I reached out to Verla Pouch by phone today to assist with re-scheduling  with the Licensed Clinical Child Psychotherapist.  Follow up plan: Unsuccessful telephone outreach attempt made. A HIPAA compliant phone message was left for the patient providing contact information and requesting a return call.  Harlene Satterfield  [facility]  [facility], [facility] Guide  Direct Dial: [phone]  Fax [phone]

## 2024-10-04 NOTE — Progress Notes (Signed)
 Complex Care Management Note  Care Guide Note 10/04/2024 Name: Tracyann Duffell MRN: [id number] DOB: 1996/08/07  Shenelle Klas is a 29 y.o. year old female who sees Celestia Rosaline SQUIBB, NP for primary care. I reached out to Verla Pouch by phone today to offer complex care management services.  Ms. Welford was given information about Complex Care Management services today including:   The Complex Care Management services include support from the care team which includes your Nurse Care Manager, Clinical Social Worker, or Pharmacist.  The Complex Care Management team is here to help remove barriers to the health concerns and goals most important to you. Complex Care Management services are voluntary, and the patient may decline or stop services at any time by request to their care team member.   Complex Care Management Consent Status: Patient agreed to services and verbal consent obtained.   Follow up plan:  Telephone appointment with complex care management team member scheduled for:  10/12/24  Encounter Outcome:  Patient Scheduled  Harlene Satterfield  [facility]  [facility], [facility] Guide  Direct Dial: [phone]  Fax [phone]

## 2024-10-12 ENCOUNTER — Other Ambulatory Visit: Payer: Self-pay

## 2024-10-12 NOTE — Patient Instructions (Signed)
 Visit Information  Elizabeth Howell was given information about Medicaid Managed Care team care coordination services as a part of their [facility] Medicaid benefit.   If you would like to schedule transportation through your [facility], please call the following number at least 2 days in advance of your appointment: [phone]   Rides for urgent appointments can also be made after hours by calling Member Services.  Call the Behavioral Health Crisis Line at [phone], at any time, 24 hours a day, 7 days a week. If you are in danger or need immediate medical attention call 911.   Patient verbalizes understanding of instructions and care plan provided today and agrees to view in MyChart. Active MyChart status and patient understanding of how to access instructions and care plan via MyChart confirmed with patient.     Telephone follow up appointment with Licensed Clinical Social Worker scheduled for: 10/22/2024 at 1:00 pm.  Murray Shawl, LCSW [place] [facility], [facility] Licensed Clinical Social Worker Direct Dial : [phone]   Following is a copy of your plan of care:   Goals Addressed             This Visit's Progress    VBCI Social Work Care Plan LCSW   On track    Problems:   Needs assistance to locate a Counselor   CSW Clinical Goal(s):   Over the next 90 days the Patient will explore community resources for a economist as evidenced by Patient report at follow up phone call.  Over the next 30 days, LCSW will provide support and supportive counseling until Patient is established with a counselor as evidenced by chart documentation.  Interventions:  Mental Health:  Evaluation of current treatment plan related to depressed mood. Active listening / Reflection utilized Depression screen reviewed Discussed providing referral options to connect for ongoing therapy Participation in  counseling encouraged PHQ2/PHQ9 completed  Patient Goals/Self-Care Activities:  Collaborate with the community LCSW for assistance with locating a counselor for ongoing therapy.  Plan:   Telephone follow up appointment with LCSW scheduled for:  10/22/2024 at 1:00 pm.

## 2024-10-12 NOTE — Patient Outreach (Signed)
 Complex Care Management   Visit Note  10/12/2024  Name:  Elizabeth Howell MRN: [id number] DOB: June 12, 1996  Situation: Referral received for Complex Care Management related to Mental/Behavioral Health diagnosis depressed mood. I obtained verbal consent from Patient.  Visit completed with Patient  on the phone  Background:   Past Medical History:  Diagnosis Date   ADHD (attention deficit hyperactivity disorder)    no current med.   Asthma    Depression    Hidradenitis suppurativa of left axilla 07/2017   History of asthma    no current med.   Hyperlipidemia    Nocturia    Pre-diabetes     Assessment: Initial Assessment completed by phone with Patient. LCSW will assist with locating a counselor for ongoing therapy. Will check in with Patient in the interim to provide support and supportive counseling as needed.  Patient Reported Symptoms:  Cognitive Cognitive Status: Alert and oriented to person, place, and time, Incoherent or nonsensical speech, Insightful and able to interpret abstract concepts, Normal speech and language skills Cognitive/Intellectual Conditions Management [RPT]: None reported or documented in medical history or problem list (diagnosed with  ADHD)   Health Maintenance Behaviors: Annual physical exam, Sleep adequate, Hobbies, Social activities (video games, reading, listening to music, outings) Healing Pattern: Average Health Facilitated by: Rest  Neurological Neurological Review of Symptoms: No symptoms reported Neurological Management Strategies: Routine screening  HEENT HEENT Symptoms Reported: No symptoms reported HEENT Management Strategies: Routine screening    Cardiovascular Cardiovascular Symptoms Reported: No symptoms reported Does patient have uncontrolled Hypertension?: No    Respiratory Respiratory Symptoms Reported: No symptoms reported    Endocrine Endocrine Symptoms Reported: No symptoms reported Is patient diabetic?: No    Gastrointestinal  Gastrointestinal Symptoms Reported: No symptoms reported      Genitourinary Genitourinary Symptoms Reported: No symptoms reported    Integumentary Additional Integumentary Details: i have a tendency to scar - as long as i take care of my skin im all right    Musculoskeletal Musculoskelatal Symptoms Reviewed: No symptoms reported        Psychosocial       Quality of Family Relationships: helpful, involved Do you feel physically threatened by others?: No    10/12/2024    PHQ2-9 Depression Screening   Little interest or pleasure in doing things Several days  Feeling down, depressed, or hopeless Several days  PHQ-2 - Total Score 2  Trouble falling or staying asleep, or sleeping too much Several days  Feeling tired or having little energy Several days  Poor appetite or overeating  Several days (overeating)  Feeling bad about yourself - or that you are a failure or have let yourself or your family down Not at all (rarely)  Trouble concentrating on things, such as reading the newspaper or watching television Several days  Moving or speaking so slowly that other people could have noticed.  Or the opposite - being so fidgety or restless that you have been moving around a lot more than usual Not at all  Thoughts that you would be better off dead, or hurting yourself in some way Not at all  PHQ2-9 Total Score 6  If you checked off any problems, how difficult have these problems made it for you to do your work, take care of things at home, or get along with other people Somewhat difficult  Depression Interventions/Treatment Counseling    There were no vitals filed for this visit. Pain Scale: 0-10 Pain Score: 0-No pain  Medications Reviewed  Today     Reviewed by Angelena Finis HERO, LCSW (Social Worker) on 10/12/24 at 1119  Med List Status: <None>   Medication Order Taking? Sig Documenting Provider Last Dose Status Informant  cetirizine  (ZYRTEC  ALLERGY) 10 MG tablet [id number]  Take 1  tablet (10 mg total) by mouth daily.  Patient not taking: Reported on 10/12/2024   Rising, [place] RIGGERS  Active   EPINEPHrine  0.3 mg/0.3 mL IJ SOAJ injection [id number] Yes Inject 0.3 mg into the muscle as needed for anaphylaxis. Celestia Rosaline SQUIBB, NP  Active   Norethindrone-Ethinyl Estradiol-Fe Biphas (LO LOESTRIN FE ) 1 MG-10 MCG / 10 MCG tablet [id number] Yes Take 1 tablet by mouth daily. Celestia Rosaline SQUIBB, NP  Active   Omega-3 Fatty Acids (FISH OIL  PO) [id number]  Take by mouth daily.  Patient not taking: Reported on 10/12/2024   [provider]  Active   traZODone  (DESYREL ) 50 MG tablet [id number] Yes Take 0.5-1 tablets (25-50 mg total) by mouth at bedtime as needed for sleep. Celestia Rosaline SQUIBB, NP  Active   VITAMIN D  PO [id number] Yes Take by mouth. [provider]  Active             Recommendation:   PCP Follow-up Specialty provider follow-up as needed Counseling   Follow Up Plan:   Telephone follow up appointment date/time:  10/22/2024 at 1:00pm.  Murray Angelena, LCSW [facility], [facility] Licensed Clinical Social Worker Direct Dial : [phone]

## 2024-10-13 ENCOUNTER — Other Ambulatory Visit (INDEPENDENT_AMBULATORY_CARE_PROVIDER_SITE_OTHER): Payer: Self-pay | Admitting: Primary Care

## 2024-10-13 DIAGNOSIS — F419 Anxiety disorder, unspecified: Secondary | ICD-10-CM

## 2024-10-14 NOTE — Telephone Encounter (Signed)
 Requested Prescriptions  Pending Prescriptions Disp Refills   traZODone  (DESYREL ) 50 MG tablet [Pharmacy Med Name: TRAZODONE  50 MG TABLET] 30 tablet 2    Sig: TAKE 0.5-1 TABLETS BY MOUTH AT BEDTIME AS NEEDED FOR SLEEP.     Psychiatry: Antidepressants - Serotonin Modulator Passed - 10/14/2024  2:20 PM      Passed - Completed PHQ-2 or PHQ-9 in the last 360 days      Passed - Valid encounter within last 6 months    Recent Outpatient Visits           3 weeks ago Immunity status testing   [place] [facility] Celestia Rosaline SQUIBB, NP   2 months ago Cervical cancer screening   [place] [facility] Celestia Rosaline SQUIBB, NP   2 years ago Hydradenitis   [place] [facility] Celestia Rosaline SQUIBB, NP   3 years ago Need for immunization against influenza   [place] [facility] Celestia Rosaline SQUIBB, NP   3 years ago Prediabetes   [place] [facility] Celestia Rosaline SQUIBB, NP

## 2024-10-22 ENCOUNTER — Telehealth: Payer: Self-pay

## 2024-10-29 ENCOUNTER — Telehealth: Payer: Self-pay

## 2024-11-10 ENCOUNTER — Ambulatory Visit (INDEPENDENT_AMBULATORY_CARE_PROVIDER_SITE_OTHER): Payer: Self-pay
